# Patient Record
Sex: Female | Born: 1945 | Race: Black or African American | Hispanic: No | Marital: Single | Smoking: Former smoker
Health system: Southern US, Community
[De-identification: ages and names within clinical notes are randomized; demographics above are authoritative.]

## PROBLEM LIST (undated history)

## (undated) DIAGNOSIS — I2510 Atherosclerotic heart disease of native coronary artery without angina pectoris: Secondary | ICD-10-CM

## (undated) DIAGNOSIS — E119 Type 2 diabetes mellitus without complications: Secondary | ICD-10-CM

## (undated) DIAGNOSIS — I10 Essential (primary) hypertension: Secondary | ICD-10-CM

## (undated) DIAGNOSIS — R32 Unspecified urinary incontinence: Secondary | ICD-10-CM

## (undated) DIAGNOSIS — B019 Varicella without complication: Secondary | ICD-10-CM

## (undated) DIAGNOSIS — E785 Hyperlipidemia, unspecified: Secondary | ICD-10-CM

## (undated) HISTORY — DX: Essential (primary) hypertension: I10

## (undated) HISTORY — DX: Type 2 diabetes mellitus without complications: E11.9

## (undated) HISTORY — DX: Hyperlipidemia, unspecified: E78.5

## (undated) HISTORY — PX: ABDOMINAL HYSTERECTOMY: SHX81

## (undated) HISTORY — DX: Varicella without complication: B01.9

## (undated) HISTORY — DX: Unspecified urinary incontinence: R32

## (undated) HISTORY — PX: CORONARY ARTERY BYPASS GRAFT: SHX141

## (undated) HISTORY — PX: EYE SURGERY: SHX253

## (undated) HISTORY — DX: Atherosclerotic heart disease of native coronary artery without angina pectoris: I25.10

---

## 2000-05-21 ENCOUNTER — Inpatient Hospital Stay (HOSPITAL_COMMUNITY): Admission: EM | Admit: 2000-05-21 | Discharge: 2000-05-31 | Payer: Self-pay | Admitting: *Deleted

## 2000-05-21 ENCOUNTER — Encounter: Payer: Self-pay | Admitting: Emergency Medicine

## 2000-05-22 HISTORY — PX: CARDIAC CATHETERIZATION: SHX172

## 2000-05-25 ENCOUNTER — Encounter: Payer: Self-pay | Admitting: Surgery

## 2000-05-27 ENCOUNTER — Encounter: Payer: Self-pay | Admitting: Surgery

## 2001-11-12 ENCOUNTER — Encounter: Payer: Self-pay | Admitting: Cardiology

## 2001-11-12 ENCOUNTER — Ambulatory Visit (HOSPITAL_COMMUNITY): Admission: RE | Admit: 2001-11-12 | Discharge: 2001-11-12 | Payer: Self-pay | Admitting: Cardiology

## 2007-03-10 HISTORY — PX: US ECHOCARDIOGRAPHY: HXRAD669

## 2010-06-02 ENCOUNTER — Other Ambulatory Visit: Payer: Self-pay | Admitting: Cardiology

## 2010-06-02 DIAGNOSIS — I10 Essential (primary) hypertension: Secondary | ICD-10-CM

## 2010-06-05 NOTE — Telephone Encounter (Signed)
escribe request Will put her on recall list for appoint in 2 months per Dr. Patty Sermons

## 2010-08-05 ENCOUNTER — Telehealth: Payer: Self-pay | Admitting: Cardiology

## 2010-08-05 NOTE — Telephone Encounter (Signed)
ATTEMPT TO CONTACT PT VIA PHONE.  RECEIVED RETURNED MAIL.  NEED TO UPDATE ADDRESS.

## 2011-05-14 ENCOUNTER — Encounter: Payer: Self-pay | Admitting: *Deleted

## 2012-10-15 ENCOUNTER — Ambulatory Visit (INDEPENDENT_AMBULATORY_CARE_PROVIDER_SITE_OTHER): Payer: Medicare Other | Admitting: Cardiology

## 2012-10-15 ENCOUNTER — Encounter: Payer: Self-pay | Admitting: Cardiology

## 2012-10-15 VITALS — BP 159/119 | HR 106 | Ht 65.0 in | Wt 233.0 lb

## 2012-10-15 DIAGNOSIS — E1169 Type 2 diabetes mellitus with other specified complication: Secondary | ICD-10-CM | POA: Insufficient documentation

## 2012-10-15 DIAGNOSIS — I119 Hypertensive heart disease without heart failure: Secondary | ICD-10-CM

## 2012-10-15 DIAGNOSIS — I2510 Atherosclerotic heart disease of native coronary artery without angina pectoris: Secondary | ICD-10-CM | POA: Insufficient documentation

## 2012-10-15 DIAGNOSIS — I259 Chronic ischemic heart disease, unspecified: Secondary | ICD-10-CM

## 2012-10-15 DIAGNOSIS — I10 Essential (primary) hypertension: Secondary | ICD-10-CM | POA: Insufficient documentation

## 2012-10-15 DIAGNOSIS — E7800 Pure hypercholesterolemia, unspecified: Secondary | ICD-10-CM

## 2012-10-15 DIAGNOSIS — E785 Hyperlipidemia, unspecified: Secondary | ICD-10-CM | POA: Insufficient documentation

## 2012-10-15 LAB — CBC WITH DIFFERENTIAL/PLATELET
Basophils Relative: 0.4 % (ref 0.0–3.0)
Eosinophils Relative: 1.1 % (ref 0.0–5.0)
HCT: 39.8 % (ref 36.0–46.0)
Hemoglobin: 13.2 g/dL (ref 12.0–15.0)
Lymphs Abs: 2.5 10*3/uL (ref 0.7–4.0)
Monocytes Relative: 10 % (ref 3.0–12.0)
Neutro Abs: 3 10*3/uL (ref 1.4–7.7)
WBC: 6.3 10*3/uL (ref 4.5–10.5)

## 2012-10-15 LAB — HEPATIC FUNCTION PANEL
ALT: 16 U/L (ref 0–35)
Total Bilirubin: 0.6 mg/dL (ref 0.3–1.2)
Total Protein: 7.7 g/dL (ref 6.0–8.3)

## 2012-10-15 LAB — BASIC METABOLIC PANEL
GFR: 83.61 mL/min (ref >60.00)
Potassium: 4.6 mEq/L (ref 3.5–5.1)
Sodium: 139 mEq/L (ref 135–145)

## 2012-10-15 LAB — LIPID PANEL
HDL: 36.7 mg/dL — ABNORMAL LOW (ref >39.00)
Triglycerides: 134 mg/dL (ref 0.0–149.0)
VLDL: 26.8 mg/dL (ref 0.0–40.0)

## 2012-10-15 MED ORDER — METOPROLOL SUCCINATE ER 25 MG PO TB24: 25.0000 mg | ORAL_TABLET | Freq: Every day | ORAL | Status: DC

## 2012-10-15 MED ORDER — HYDROCHLOROTHIAZIDE 25 MG PO TABS: ORAL_TABLET | ORAL | Status: DC

## 2012-10-15 NOTE — Patient Instructions (Addendum)
Will obtain labs today and call you with the results (lp/bmet/hfp)  START TOPROL XL (METOPROLOL) 25 MG DAILY  HCTZ 25 MG 1/2 TABLET DAILY  RX's have been sent to [facility]  TAKE THE ASPIRIN 81 MG DAILY  Your physician recommends that you schedule a follow-up appointment in: 2 WEEKS

## 2012-10-15 NOTE — Assessment & Plan Note (Signed)
Blood pressure is excessive.  Repeat by me is 156/115.  We are going to restart her on Toprol-XL 25 mg daily and hydrochlorothiazide 25 mg one half tablet daily.  She is watching her dietary salt.  Since we last saw her 4 years ago her weight is down 12 pounds.

## 2012-10-15 NOTE — Assessment & Plan Note (Signed)
The patient has not been experiencing any recurrent chest pain or angina pectoris or symptoms of CHF

## 2012-10-15 NOTE — Progress Notes (Signed)
Haley Curtis Date of Birth:  [birth year] [facility] [address] [place], [place]  [address] [phone]         Fax   [phone]  History of Present Illness: This pleasant 67 year old African American woman is seen after a 4 year absence.  She has a history of known coronary artery disease.  She had previous coronary artery bypass graft surgery in 2002.  We last saw her in 2010.  She has had a history of hypertension and hypercholesterolemia.  She was doing well when last saw her.  Since then she has stopped her medication.  Her blood pressure today is high.  She has not been experiencing any recurrent chest pain or angina.  She has had some headaches.  Current Outpatient Prescriptions  Medication Sig Dispense Refill  . aspirin 81 MG tablet Take 81 mg by mouth daily.      . Multiple Vitamin (MULTIVITAMIN) capsule Take 1 capsule by mouth daily.      . hydrochlorothiazide (HYDRODIURIL) 25 MG tablet 1/2 tablet daily  30 tablet  5  . metoprolol succinate (TOPROL XL) 25 MG 24 hr tablet Take 1 tablet (25 mg total) by mouth daily.  30 tablet  5   No current facility-administered medications for this visit.    No Known Allergies  Patient Active Problem List   Diagnosis Date Noted  . Ischemic heart disease 10/15/2012  . Hypercholesterolemia 10/15/2012  . Benign hypertensive heart disease without heart failure 10/15/2012    History  Smoking status  . Former Smoker  . Quit date: 02/04/2000  Smokeless tobacco  . Not on file    History  Alcohol Use No    Family History  Problem Relation Age of Onset  . Hypertension Mother   . Stroke Mother   . Heart attack Father   . Heart attack Brother     Review of Systems: Constitutional: no fever chills diaphoresis or fatigue or change in weight.  Head and neck: no hearing loss, no epistaxis, no photophobia or visual disturbance. Respiratory: No cough, shortness of breath or wheezing. Cardiovascular: No chest  pain peripheral edema, palpitations. Gastrointestinal: No abdominal distention, no abdominal pain, no change in bowel habits hematochezia or melena. Genitourinary: No dysuria, no frequency, no urgency, no nocturia. Musculoskeletal:No arthralgias, no back pain, no gait disturbance or myalgias. Neurological: No dizziness, no headaches, no numbness, no seizures, no syncope, no weakness, no tremors. Hematologic: No lymphadenopathy, no easy bruising. Psychiatric: No confusion, no hallucinations, no sleep disturbance.    Physical Exam: Filed Vitals:   10/15/12 1445  BP: 159/119  Pulse: 106   the general appearance reveals a well-developed well-nourished middle-aged woman in no distress.The head and neck exam reveals pupils equal and reactive.  Extraocular movements are full.  There is no scleral icterus.  The mouth and pharynx are normal.  The neck is supple.  The carotids reveal no bruits.  The jugular venous pressure is normal.  The  thyroid is not enlarged.  There is no lymphadenopathy.  The chest is clear to percussion and auscultation.  There are no rales or rhonchi.  Expansion of the chest is symmetrical.  The precordium is quiet.  The first heart sound is normal.  The second heart sound is physiologically split.  There is no murmur gallop rub or click.  There is no abnormal lift or heave.  The abdomen is soft and nontender.  The bowel sounds are normal.  The liver and spleen are not enlarged.  There are no abdominal masses.  There are no abdominal bruits.  Extremities reveal good pedal pulses.  There is no phlebitis or edema.  There is no cyanosis or clubbing.  Strength is normal and symmetrical in all extremities.  There is no lateralizing weakness.  There are no sensory deficits.  The skin is warm and dry.  There is no rash.  EKG shows normal sinus rhythm and nonspecific T-wave abnormality   Assessment / Plan: Uncontrolled essential hypertension. Stable ischemic heart disease status post  CABG in 2002 History of dyslipidemia  Plan: Restart Toprol and hydrochlorothiazide.  Continue baby aspirin.  Blood work today pending to determine treatment of lipids. Return in 2 weeks for followup office visit to be sure her blood pressure is satisfactory

## 2012-10-15 NOTE — Assessment & Plan Note (Signed)
Previously the patient had been on Crestor.  We will obtain fasting labs and then decide what statin she should be on.

## 2012-10-15 NOTE — Progress Notes (Signed)
Quick Note:  Please report to patient. The recent labs are stable. Continue same medication and careful diet.Add lipitor 20 mg daily ______

## 2012-10-18 ENCOUNTER — Telehealth: Payer: Self-pay | Admitting: *Deleted

## 2012-10-18 DIAGNOSIS — E7800 Pure hypercholesterolemia, unspecified: Secondary | ICD-10-CM

## 2012-10-18 MED ORDER — ATORVASTATIN CALCIUM 20 MG PO TABS: 20.0000 mg | ORAL_TABLET | Freq: Every day | ORAL | Status: DC

## 2012-10-18 NOTE — Telephone Encounter (Signed)
Advised patient of lab results  

## 2012-10-18 NOTE — Telephone Encounter (Signed)
Message copied by Burnell Blanks on Mon Oct 18, 2012  5:46 PM ------      Message from: Cassell Clement      Created: Fri Oct 15, 2012  9:35 PM       Please report to patient.  The recent labs are stable. Continue same medication and careful diet.Add lipitor 20 mg daily ------

## 2012-10-27 ENCOUNTER — Encounter: Payer: Self-pay | Admitting: *Deleted

## 2012-11-01 ENCOUNTER — Ambulatory Visit (INDEPENDENT_AMBULATORY_CARE_PROVIDER_SITE_OTHER): Payer: Medicare Other | Admitting: Cardiology

## 2012-11-01 ENCOUNTER — Encounter: Payer: Self-pay | Admitting: Cardiology

## 2012-11-01 VITALS — BP 160/92 | HR 76 | Ht 65.0 in | Wt 234.0 lb

## 2012-11-01 DIAGNOSIS — I119 Hypertensive heart disease without heart failure: Secondary | ICD-10-CM | POA: Diagnosis not present

## 2012-11-01 DIAGNOSIS — E7800 Pure hypercholesterolemia, unspecified: Secondary | ICD-10-CM | POA: Diagnosis not present

## 2012-11-01 DIAGNOSIS — I259 Chronic ischemic heart disease, unspecified: Secondary | ICD-10-CM

## 2012-11-01 MED ORDER — SIMVASTATIN 40 MG PO TABS: 40.0000 mg | ORAL_TABLET | Freq: Every day | ORAL | Status: DC

## 2012-11-01 NOTE — Assessment & Plan Note (Signed)
Her generic Lipitor is too expensive for her.  We will switch her to simvastatin 40 mg once daily.

## 2012-11-01 NOTE — Patient Instructions (Addendum)
Your physician recommends that you schedule a follow-up appointment in: 3 months with fasting labs (LP/BMET/HFP)   STOP YOUR LIPITOR AND START SIMVASTATIN 40 MG DAILY
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
## 2012-11-01 NOTE — Progress Notes (Signed)
Haley Curtis Date of Birth:  08/21/1945 [facility] [address] [place], [place]  [address] [phone]         Fax   [phone]  History of Present Illness: This pleasant 67 year old [place] American woman is seen for a two-week followup office visit.  We had seen her earlier this month after a 4 year absence.  She has a history of known coronary artery disease.  She had previous coronary artery bypass graft surgery in 2002.  We last saw her in 2010.  She has had a history of hypertension and hypercholesterolemia.  We saw her 2 weeks ago her blood pressure was poorly controlled and her lipids were poorly controlled with LDL of 183.  We prescribed generic Lipitor for her but it was very expensive, costing $130 for 30 day prescription.  She will not have insurance for drug coverage until January. Current Outpatient Prescriptions  Medication Sig Dispense Refill  . aspirin 81 MG tablet Take 81 mg by mouth daily.      . hydrochlorothiazide (HYDRODIURIL) 25 MG tablet 1/2 tablet daily  30 tablet  5  . metoprolol succinate (TOPROL XL) 25 MG 24 hr tablet Take 1 tablet (25 mg total) by mouth daily.  30 tablet  5  . Multiple Vitamin (MULTIVITAMIN) capsule Take 1 capsule by mouth daily.      . simvastatin (ZOCOR) 40 MG tablet Take 1 tablet (40 mg total) by mouth at bedtime.  90 tablet  3   No current facility-administered medications for this visit.    No Known Allergies  Patient Active Problem List   Diagnosis Date Noted  . Ischemic heart disease 10/15/2012  . Hypercholesterolemia 10/15/2012  . Benign hypertensive heart disease without heart failure 10/15/2012    History  Smoking status  . Former Smoker  . Quit date: 02/04/2000  Smokeless tobacco  . Not on file    History  Alcohol Use No    Family History  Problem Relation Age of Onset  . Hypertension Mother   . Stroke Mother   . Heart attack Father   . Heart attack Brother     Review of  Systems: Constitutional: no fever chills diaphoresis or fatigue or change in weight.  Head and neck: no hearing loss, no epistaxis, no photophobia or visual disturbance. Respiratory: No cough, shortness of breath or wheezing. Cardiovascular: No chest pain peripheral edema, palpitations. Gastrointestinal: No abdominal distention, no abdominal pain, no change in bowel habits hematochezia or melena. Genitourinary: No dysuria, no frequency, no urgency, no nocturia. Musculoskeletal:No arthralgias, no back pain, no gait disturbance or myalgias. Neurological: No dizziness, no headaches, no numbness, no seizures, no syncope, no weakness, no tremors. Hematologic: No lymphadenopathy, no easy bruising. Psychiatric: No confusion, no hallucinations, no sleep disturbance.    Physical Exam: Filed Vitals:   11/01/12 0850  BP: 160/92  Pulse: 76   the general appearance reveals a well-developed well-nourished middle-aged woman in no distress.The head and neck exam reveals pupils equal and reactive.  Extraocular movements are full.  There is no scleral icterus.  The mouth and pharynx are normal.  The neck is supple.  The carotids reveal no bruits.  The jugular venous pressure is normal.  The  thyroid is not enlarged.  There is no lymphadenopathy.  The chest is clear to percussion and auscultation.  There are no rales or rhonchi.  Expansion of the chest is symmetrical.  The precordium is quiet.  The first heart sound is normal.  The second heart sound is physiologically split.  There is no murmur gallop rub or click.  There is no abnormal lift or heave.  The abdomen is soft and nontender.  The bowel sounds are normal.  The liver and spleen are not enlarged.  There are no abdominal masses.  There are no abdominal bruits.  Extremities reveal good pedal pulses.  There is no phlebitis or edema.  There is no cyanosis or clubbing.  Strength is normal and symmetrical in all extremities.  There is no lateralizing weakness.   There are no sensory deficits.  The skin is warm and dry.  There is no rash.     Assessment / Plan: Essential hypertension, improving Stable ischemic heart disease status post CABG in 2002.  No chest pain History of dyslipidemia with recent LDL 183 off medication.  Plan: Continue current dose of metoprolol and HCTZ.  Continue baby aspirin.  Switch to simvastatin 40 mg daily for lipid control.  Strict diet and weight loss.  Recheck in 3 months for followup office visit lipid panel hepatic function panel and basal metabolic panel

## 2012-11-01 NOTE — Assessment & Plan Note (Signed)
The patient has not been experiencing any chest pain or angina. 

## 2012-11-08 ENCOUNTER — Other Ambulatory Visit: Payer: Self-pay | Admitting: *Deleted

## 2012-11-08 DIAGNOSIS — I119 Hypertensive heart disease without heart failure: Secondary | ICD-10-CM

## 2012-11-08 DIAGNOSIS — I259 Chronic ischemic heart disease, unspecified: Secondary | ICD-10-CM

## 2012-11-08 DIAGNOSIS — E7800 Pure hypercholesterolemia, unspecified: Secondary | ICD-10-CM

## 2012-11-08 MED ORDER — HYDROCHLOROTHIAZIDE 25 MG PO TABS: ORAL_TABLET | ORAL | Status: DC

## 2012-11-08 MED ORDER — METOPROLOL SUCCINATE ER 25 MG PO TB24: 25.0000 mg | ORAL_TABLET | Freq: Every day | ORAL | Status: DC

## 2012-11-08 NOTE — Telephone Encounter (Signed)
Change pharmacy from [facility] to [facility]

## 2013-01-28 DIAGNOSIS — H52229 Regular astigmatism, unspecified eye: Secondary | ICD-10-CM | POA: Diagnosis not present

## 2013-01-28 DIAGNOSIS — H35379 Puckering of macula, unspecified eye: Secondary | ICD-10-CM | POA: Diagnosis not present

## 2013-01-28 DIAGNOSIS — H524 Presbyopia: Secondary | ICD-10-CM | POA: Diagnosis not present

## 2013-01-28 DIAGNOSIS — H5200 Hypermetropia, unspecified eye: Secondary | ICD-10-CM | POA: Diagnosis not present

## 2013-01-31 ENCOUNTER — Other Ambulatory Visit: Payer: Self-pay

## 2013-01-31 ENCOUNTER — Other Ambulatory Visit (INDEPENDENT_AMBULATORY_CARE_PROVIDER_SITE_OTHER): Payer: Medicare Other

## 2013-01-31 DIAGNOSIS — E7800 Pure hypercholesterolemia, unspecified: Secondary | ICD-10-CM

## 2013-01-31 DIAGNOSIS — I119 Hypertensive heart disease without heart failure: Secondary | ICD-10-CM

## 2013-01-31 DIAGNOSIS — I259 Chronic ischemic heart disease, unspecified: Secondary | ICD-10-CM

## 2013-01-31 LAB — HEPATIC FUNCTION PANEL
Albumin: 3.9 g/dL (ref 3.5–5.2)
Alkaline Phosphatase: 67 U/L (ref 39–117)
Total Bilirubin: 0.8 mg/dL (ref 0.3–1.2)
Total Protein: 6.9 g/dL (ref 6.0–8.3)

## 2013-01-31 LAB — LIPID PANEL
Cholesterol: 139 mg/dL (ref 0–200)
HDL: 35.7 mg/dL — ABNORMAL LOW (ref >39.00)
Triglycerides: 102 mg/dL (ref 0.0–149.0)

## 2013-01-31 LAB — BASIC METABOLIC PANEL
BUN: 18 mg/dL (ref 6–23)
CO2: 27 mEq/L (ref 19–32)
Calcium: 9.6 mg/dL (ref 8.4–10.5)
Creatinine, Ser: 1 mg/dL (ref 0.4–1.2)
Glucose, Bld: 308 mg/dL — ABNORMAL HIGH (ref 70–99)
Sodium: 138 mEq/L (ref 135–145)

## 2013-01-31 MED ORDER — HYDROCHLOROTHIAZIDE 25 MG PO TABS: ORAL_TABLET | ORAL | Status: DC

## 2013-01-31 MED ORDER — SIMVASTATIN 40 MG PO TABS: 40.0000 mg | ORAL_TABLET | Freq: Every day | ORAL | Status: DC

## 2013-01-31 MED ORDER — METOPROLOL SUCCINATE ER 25 MG PO TB24: 25.0000 mg | ORAL_TABLET | Freq: Every day | ORAL | Status: DC

## 2013-02-01 ENCOUNTER — Telehealth: Payer: Self-pay | Admitting: *Deleted

## 2013-02-01 ENCOUNTER — Encounter: Payer: Self-pay | Admitting: *Deleted

## 2013-02-01 DIAGNOSIS — E113419 Type 2 diabetes mellitus with severe nonproliferative diabetic retinopathy with macular edema, unspecified eye: Secondary | ICD-10-CM | POA: Insufficient documentation

## 2013-02-01 DIAGNOSIS — E118 Type 2 diabetes mellitus with unspecified complications: Secondary | ICD-10-CM | POA: Insufficient documentation

## 2013-02-01 MED ORDER — METFORMIN HCL 500 MG PO TABS: 500.0000 mg | ORAL_TABLET | Freq: Every day | ORAL | Status: DC

## 2013-02-01 NOTE — Telephone Encounter (Signed)
Advised patient of results and new Rx, scheduled follow up labs and ov

## 2013-02-01 NOTE — Telephone Encounter (Signed)
Message copied by Burnell Blanks on Tue Feb 01, 2013  2:50 PM ------      Message from: Cassell Clement      Created: Tue Feb 01, 2013  9:28 AM       Please report.  Cholesterol good but BS is very high. Add 250.02 to problem list. Needs to follow a diabetic diet. Add metformin 500 mg daily. See in about a month and get BMET and A1C and an OV.  Lose weight. ------

## 2013-02-09 DIAGNOSIS — H35379 Puckering of macula, unspecified eye: Secondary | ICD-10-CM | POA: Diagnosis not present

## 2013-02-09 DIAGNOSIS — H3340 Traction detachment of retina, unspecified eye: Secondary | ICD-10-CM | POA: Diagnosis not present

## 2013-02-09 DIAGNOSIS — S0510XA Contusion of eyeball and orbital tissues, unspecified eye, initial encounter: Secondary | ICD-10-CM | POA: Diagnosis not present

## 2013-02-09 DIAGNOSIS — H4310 Vitreous hemorrhage, unspecified eye: Secondary | ICD-10-CM | POA: Diagnosis not present

## 2013-02-17 DIAGNOSIS — H35059 Retinal neovascularization, unspecified, unspecified eye: Secondary | ICD-10-CM | POA: Diagnosis not present

## 2013-02-17 DIAGNOSIS — H35379 Puckering of macula, unspecified eye: Secondary | ICD-10-CM | POA: Diagnosis not present

## 2013-02-17 DIAGNOSIS — H35349 Macular cyst, hole, or pseudohole, unspecified eye: Secondary | ICD-10-CM | POA: Diagnosis not present

## 2013-02-25 DIAGNOSIS — H35379 Puckering of macula, unspecified eye: Secondary | ICD-10-CM | POA: Diagnosis not present

## 2013-03-02 ENCOUNTER — Encounter: Payer: Self-pay | Admitting: Cardiology

## 2013-03-02 ENCOUNTER — Ambulatory Visit (INDEPENDENT_AMBULATORY_CARE_PROVIDER_SITE_OTHER): Payer: Medicare Other | Admitting: Cardiology

## 2013-03-02 VITALS — BP 152/97 | HR 95 | Ht 65.0 in | Wt 216.0 lb

## 2013-03-02 DIAGNOSIS — E1165 Type 2 diabetes mellitus with hyperglycemia: Principal | ICD-10-CM

## 2013-03-02 DIAGNOSIS — I259 Chronic ischemic heart disease, unspecified: Secondary | ICD-10-CM

## 2013-03-02 DIAGNOSIS — IMO0001 Reserved for inherently not codable concepts without codable children: Secondary | ICD-10-CM

## 2013-03-02 DIAGNOSIS — E7800 Pure hypercholesterolemia, unspecified: Secondary | ICD-10-CM

## 2013-03-02 DIAGNOSIS — I119 Hypertensive heart disease without heart failure: Secondary | ICD-10-CM

## 2013-03-02 LAB — BASIC METABOLIC PANEL
BUN: 20 mg/dL (ref 6–23)
CHLORIDE: 99 meq/L (ref 96–112)
CO2: 30 mEq/L (ref 19–32)
Calcium: 9.8 mg/dL (ref 8.4–10.5)
Creatinine, Ser: 0.9 mg/dL (ref 0.4–1.2)
GFR: 84.63 mL/min (ref >60.00)
Glucose, Bld: 248 mg/dL — ABNORMAL HIGH (ref 70–99)
POTASSIUM: 4.1 meq/L (ref 3.5–5.1)
SODIUM: 136 meq/L (ref 135–145)

## 2013-03-02 LAB — HEMOGLOBIN A1C: HEMOGLOBIN A1C: 12.5 % — AB (ref 4.6–6.5)

## 2013-03-02 NOTE — Patient Instructions (Signed)
Will obtain labs today and call you with the results (bmet/a1)  Your physician recommends that you continue on your current medications as directed. Please refer to the Current Medication list given to you today.  Your physician wants you to follow-up in: 4 months with fasting labs (lp/bmet/hfp/a1c)  You will receive a reminder letter in the mail two months in advance. If you don't receive a letter, please call our office to schedule the follow-up appointment.

## 2013-03-02 NOTE — Assessment & Plan Note (Signed)
Her dyspnea has improved since last visit.  Her 18 pound weight loss has helped this.  She is not having any symptoms of CHF

## 2013-03-02 NOTE — Assessment & Plan Note (Signed)
The patient has not had any recurrent chest pain or angina. 

## 2013-03-02 NOTE — Assessment & Plan Note (Signed)
The patient has been on a careful diet and has lost 18 pounds since last visit.

## 2013-03-02 NOTE — Progress Notes (Signed)
Haley SporeLena Surber Date of Birth:  [birth year] [address] [place], [place]  [id number] [phone]         Fax   [phone]  History of Present Illness: This pleasant 68 year old African American woman is seen for a four-month followup office visit.  We had seen her in September 2014 after a 4 year absence.  She has a history of known coronary artery disease.  She had previous coronary artery bypass graft surgery in 2002.  We last saw her in 2010.  She has had a history of hypertension and hypercholesterolemia.  Since last visit she has been doing better in terms of her medical issues.  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 Stephannie LiJason Sanders on 02/17/2013.   Current Outpatient Prescriptions  Medication Sig Dispense Refill  . aspirin 81 MG tablet Take 81 mg by mouth daily.      . hydrochlorothiazide (HYDRODIURIL) 25 MG tablet 1/2 tablet daily  90 tablet  3  . metFORMIN (GLUCOPHAGE) 500 MG tablet Take 1 tablet (500 mg total) by mouth daily.  90 tablet  1  . metoprolol succinate (TOPROL XL) 25 MG 24 hr tablet Take 1 tablet (25 mg total) by mouth daily.  90 tablet  3  . Multiple Vitamin (MULTIVITAMIN) capsule Take 1 capsule by mouth daily.      . simvastatin (ZOCOR) 40 MG tablet Take 1 tablet (40 mg total) by mouth at bedtime.  90 tablet  3   No current facility-administered medications for this visit.    No Known Allergies  Patient Active Problem List   Diagnosis Date Noted  . Type II or unspecified type diabetes mellitus without mention of complication, uncontrolled 02/01/2013  . Ischemic heart disease 10/15/2012  . Hypercholesterolemia 10/15/2012  . Benign hypertensive heart disease without heart failure 10/15/2012    History  Smoking status  . Former Smoker  . Quit date: 02/04/2000  Smokeless tobacco  . Not on file    History  Alcohol Use No    Family History  Problem Relation Age of Onset  . Hypertension Mother    . Stroke Mother   . Heart attack Father   . Heart attack Brother     Review of Systems: Constitutional: no fever chills diaphoresis or fatigue or change in weight.  Head and neck: no hearing loss, no epistaxis, no photophobia or visual disturbance. Respiratory: No cough, shortness of breath or wheezing. Cardiovascular: No chest pain peripheral edema, palpitations. Gastrointestinal: No abdominal distention, no abdominal pain, no change in bowel habits hematochezia or melena. Genitourinary: No dysuria, no frequency, no urgency, no nocturia. Musculoskeletal:No arthralgias, no back pain, no gait disturbance or myalgias. Neurological: No dizziness, no headaches, no numbness, no seizures, no syncope, no weakness, no tremors. Hematologic: No lymphadenopathy, no easy bruising. Psychiatric: No confusion, no hallucinations, no sleep disturbance.    Physical Exam: Filed Vitals:   03/02/13 1011  BP: 152/97  Pulse: 95   the general appearance reveals a well-developed well-nourished middle-aged woman in no distress.The head and neck exam reveals pupils equal and reactive.  Extraocular movements are full.  There is no scleral icterus.  The mouth and pharynx are normal.  The neck is supple.  The carotids reveal no bruits.  The jugular venous pressure is normal.  The  thyroid is not enlarged.  There is no lymphadenopathy.  The chest is clear to percussion and auscultation.  There are no rales or  rhonchi.  Expansion of the chest is symmetrical.  The precordium is quiet.  The first heart sound is normal.  The second heart sound is physiologically split.  There is no murmur gallop rub or click.  There is no abnormal lift or heave.  The abdomen is soft and nontender.  The bowel sounds are normal.  The liver and spleen are not enlarged.  There are no abdominal masses.  There are no abdominal bruits.  Extremities reveal good pedal pulses.  There is no phlebitis or edema.  There is no cyanosis or clubbing.   Strength is normal and symmetrical in all extremities.  There is no lateralizing weakness.  There are no sensory deficits.  The skin is warm and dry.  There is no rash.     Assessment / Plan: Essential hypertension, improving Stable ischemic heart disease status post CABG in 2002.  No chest pain History of dyslipidemia with recent LDL 183 off medication.  Repeat labs are improved. She will continue same medication.  She is now on metformin for her diabetes and we are checking an A1c and a basal metabolic panel today. Recheck in 4 months for office visit A1c lipid panel hepatic function panel and basal metabolic panel.  Continue weight loss and careful diet

## 2013-03-03 ENCOUNTER — Telehealth: Payer: Self-pay | Admitting: *Deleted

## 2013-03-03 DIAGNOSIS — E1165 Type 2 diabetes mellitus with hyperglycemia: Principal | ICD-10-CM

## 2013-03-03 DIAGNOSIS — IMO0001 Reserved for inherently not codable concepts without codable children: Secondary | ICD-10-CM

## 2013-03-03 MED ORDER — METFORMIN HCL 500 MG PO TABS: 500.0000 mg | ORAL_TABLET | Freq: Two times a day (BID) | ORAL | Status: DC

## 2013-03-03 NOTE — Telephone Encounter (Signed)
Advised patient of lab results  

## 2013-03-03 NOTE — Telephone Encounter (Signed)
Message copied by Burnell BlanksPRATT, Jeric Slagel B on Thu Mar 03, 2013  9:04 AM ------      Message from: Cassell ClementBRACKBILL, THOMAS      Created: Wed Mar 02, 2013  9:13 PM       A1C very high.  BS still too high since starting metformin.  Increase metformin to 500 mg BID. ------

## 2013-03-23 DIAGNOSIS — H35379 Puckering of macula, unspecified eye: Secondary | ICD-10-CM | POA: Diagnosis not present

## 2013-03-29 ENCOUNTER — Encounter: Payer: Self-pay | Admitting: Cardiology

## 2013-07-05 ENCOUNTER — Ambulatory Visit (INDEPENDENT_AMBULATORY_CARE_PROVIDER_SITE_OTHER): Payer: Medicare Other | Admitting: Cardiology

## 2013-07-05 ENCOUNTER — Other Ambulatory Visit (INDEPENDENT_AMBULATORY_CARE_PROVIDER_SITE_OTHER): Payer: Medicare Other

## 2013-07-05 ENCOUNTER — Encounter: Payer: Self-pay | Admitting: Cardiology

## 2013-07-05 ENCOUNTER — Encounter (INDEPENDENT_AMBULATORY_CARE_PROVIDER_SITE_OTHER): Payer: Self-pay

## 2013-07-05 VITALS — BP 153/87 | HR 79 | Ht 65.0 in | Wt 226.0 lb

## 2013-07-05 DIAGNOSIS — IMO0001 Reserved for inherently not codable concepts without codable children: Secondary | ICD-10-CM

## 2013-07-05 DIAGNOSIS — I119 Hypertensive heart disease without heart failure: Secondary | ICD-10-CM

## 2013-07-05 DIAGNOSIS — E1165 Type 2 diabetes mellitus with hyperglycemia: Principal | ICD-10-CM

## 2013-07-05 DIAGNOSIS — E7800 Pure hypercholesterolemia, unspecified: Secondary | ICD-10-CM

## 2013-07-05 DIAGNOSIS — I259 Chronic ischemic heart disease, unspecified: Secondary | ICD-10-CM

## 2013-07-05 LAB — BASIC METABOLIC PANEL
BUN: 18 mg/dL (ref 6–23)
CHLORIDE: 104 meq/L (ref 96–112)
CO2: 27 mEq/L (ref 19–32)
Calcium: 9.3 mg/dL (ref 8.4–10.5)
Creatinine, Ser: 0.9 mg/dL (ref 0.4–1.2)
GFR: 81.27 mL/min (ref >60.00)
Glucose, Bld: 127 mg/dL — ABNORMAL HIGH (ref 70–99)
POTASSIUM: 3.8 meq/L (ref 3.5–5.1)
Sodium: 140 mEq/L (ref 135–145)

## 2013-07-05 LAB — HEPATIC FUNCTION PANEL
ALBUMIN: 3.7 g/dL (ref 3.5–5.2)
ALT: 21 U/L (ref 0–35)
AST: 15 U/L (ref 0–37)
Alkaline Phosphatase: 86 U/L (ref 39–117)
Bilirubin, Direct: 0.1 mg/dL (ref 0.0–0.3)
Total Bilirubin: 0.6 mg/dL (ref 0.2–1.2)
Total Protein: 7.3 g/dL (ref 6.0–8.3)

## 2013-07-05 LAB — LIPID PANEL
Cholesterol: 153 mg/dL (ref 0–200)
HDL: 38.5 mg/dL — AB (ref >39.00)
LDL CALC: 86 mg/dL (ref 0–99)
TRIGLYCERIDES: 142 mg/dL (ref 0.0–149.0)
Total CHOL/HDL Ratio: 4
VLDL: 28.4 mg/dL (ref 0.0–40.0)

## 2013-07-05 LAB — HEMOGLOBIN A1C: Hgb A1c MFr Bld: 7.3 % — ABNORMAL HIGH (ref 4.6–6.5)

## 2013-07-05 NOTE — Assessment & Plan Note (Signed)
No episodes of hypoglycemia.

## 2013-07-05 NOTE — Assessment & Plan Note (Signed)
Blood pressure is higher today because of a 10 pound weight gain since last visit.  She is trying to exercise.  She uses an exercise bicycle for about one hour at least 4 days a week. She has not been having any headaches or dizzy spells

## 2013-07-05 NOTE — Patient Instructions (Signed)
Will obtain labs today and call you with the results (lp.bmet/hfp/a1c)  Your physician recommends that you continue on your current medications as directed. Please refer to the Current Medication list given to you today.  Your physician wants you to follow-up in: 6 months with fasting labs (lp/bmet/hfp/a1c)  You will receive a reminder letter in the mail two months in advance. If you don't receive a letter, please call our office to schedule the follow-up appointment.  

## 2013-07-05 NOTE — Assessment & Plan Note (Signed)
No chest pain or angina.  

## 2013-07-05 NOTE — Progress Notes (Signed)
Haley SporeLena Curtis Date of Birth:  1945/12/14 [facility] [address] [place], [place]  [id number] [phone]        Fax   [phone]   History of Present Illness: This pleasant 68 year old [place] woman is seen for a six-month followup office visit. We had seen her in September 2014 after a 4 year absence. She has a history of known coronary artery disease. She had previous coronary artery bypass graft surgery in 2002 . We last saw her in 2010. She has had a history of hypertension and hypercholesterolemia. Since last visit she has been doing better in terms of her medical issues.  She has been trying to lose weight.  However her weight is up 10 pounds since last visit.  Current Outpatient Prescriptions  Medication Sig Dispense Refill  . aspirin 81 MG tablet Take 81 mg by mouth daily.      . hydrochlorothiazide (HYDRODIURIL) 25 MG tablet 1/2 tablet daily  90 tablet  3  . metFORMIN (GLUCOPHAGE) 500 MG tablet Take 1 tablet (500 mg total) by mouth 2 (two) times daily with a meal.  180 tablet  3  . metoprolol succinate (TOPROL XL) 25 MG 24 hr tablet Take 1 tablet (25 mg total) by mouth daily.  90 tablet  3  . Multiple Vitamin (MULTIVITAMIN) capsule Take 1 capsule by mouth daily.      . simvastatin (ZOCOR) 40 MG tablet Take 1 tablet (40 mg total) by mouth at bedtime.  90 tablet  3   No current facility-administered medications for this visit.    No Known Allergies  Patient Active Problem List   Diagnosis Date Noted  . Type II or unspecified type diabetes mellitus without mention of complication, uncontrolled 02/01/2013  . Ischemic heart disease 10/15/2012  . Hypercholesterolemia 10/15/2012  . Benign hypertensive heart disease without heart failure 10/15/2012    History  Smoking status  . Former Smoker  . Quit date: 02/04/2000  Smokeless tobacco  . Not on file    History  Alcohol Use No    Family History  Problem Relation Age of Onset  .  Hypertension Mother   . Stroke Mother   . Heart attack Father   . Heart attack Brother     Review of Systems: Constitutional: no fever chills diaphoresis or fatigue or change in weight.  Head and neck: no hearing loss, no epistaxis, no photophobia or visual disturbance. Respiratory: No cough, shortness of breath or wheezing. Cardiovascular: No chest pain peripheral edema, palpitations. Gastrointestinal: No abdominal distention, no abdominal pain, no change in bowel habits hematochezia or melena. Genitourinary: No dysuria, no frequency, no urgency, no nocturia. Musculoskeletal:No arthralgias, no back pain, no gait disturbance or myalgias. Neurological: No dizziness, no headaches, no numbness, no seizures, no syncope, no weakness, no tremors. Hematologic: No lymphadenopathy, no easy bruising. Psychiatric: No confusion, no hallucinations, no sleep disturbance.    Physical Exam: Filed Vitals:   07/05/13 0802  BP: 153/87  Pulse: 79   the general appearance reveals a well-developed well-nourished woman in no distress.The head and neck exam reveals pupils equal and reactive.  Extraocular movements are full.  There is no scleral icterus.  The mouth and pharynx are normal.  The neck is supple.  The carotids reveal no bruits.  The jugular venous pressure is normal.  The  thyroid is not enlarged.  There is no lymphadenopathy.  The chest is clear to percussion and auscultation.  There are no rales  or rhonchi.  Expansion of the chest is symmetrical.  The precordium is quiet.  The first heart sound is normal.  The second heart sound is physiologically split.  There is no murmur gallop rub or click.  There is no abnormal lift or heave.  The abdomen is soft and nontender.  The bowel sounds are normal.  The liver and spleen are not enlarged.  There are no abdominal masses.  There are no abdominal bruits.  Extremities reveal good pedal pulses.  There is no phlebitis or edema.  There is no cyanosis or  clubbing.  Strength is normal and symmetrical in all extremities.  There is no lateralizing weakness.  There are no sensory deficits.  The skin is warm and dry.  There is no rash.  She does have a dark nevus on her right cheek and also 1 alongside the left side of her nose.  She states that both of these have been present a long time and have been unchanged in size.     Assessment / Plan: 1. essential hypertension 2. ischemic heart disease status post CABG 2002 3. diabetes mellitus type 2 4. Hypercholesterolemia  Plan: Continue same medication.  Check lab work today.  We filled all of her medicines for 90 days.  Recheck in 6 months for office visit EKG lipid panel hepatic function panel basal metabolic panel and A1c We talked about the importance of weight loss and its beneficial effect on her other medical problems.

## 2013-07-06 NOTE — Progress Notes (Signed)
Quick Note:  Please report to patient. The recent labs are stable. Continue same medication and careful diet. The blood sugar is much better than previously. The A1c is also much better. Continue good program of diet and exercise ______

## 2013-11-09 ENCOUNTER — Other Ambulatory Visit: Payer: Self-pay | Admitting: Cardiology

## 2014-01-10 ENCOUNTER — Encounter: Payer: Self-pay | Admitting: Cardiology

## 2014-01-10 ENCOUNTER — Other Ambulatory Visit (INDEPENDENT_AMBULATORY_CARE_PROVIDER_SITE_OTHER): Payer: Medicare Other | Admitting: *Deleted

## 2014-01-10 ENCOUNTER — Ambulatory Visit (INDEPENDENT_AMBULATORY_CARE_PROVIDER_SITE_OTHER): Payer: Medicare Other | Admitting: Cardiology

## 2014-01-10 VITALS — BP 130/90 | HR 74 | Ht 65.0 in | Wt 230.0 lb

## 2014-01-10 DIAGNOSIS — E1165 Type 2 diabetes mellitus with hyperglycemia: Secondary | ICD-10-CM

## 2014-01-10 DIAGNOSIS — I259 Chronic ischemic heart disease, unspecified: Secondary | ICD-10-CM

## 2014-01-10 DIAGNOSIS — IMO0002 Reserved for concepts with insufficient information to code with codable children: Secondary | ICD-10-CM

## 2014-01-10 DIAGNOSIS — E7800 Pure hypercholesterolemia, unspecified: Secondary | ICD-10-CM

## 2014-01-10 DIAGNOSIS — I119 Hypertensive heart disease without heart failure: Secondary | ICD-10-CM

## 2014-01-10 DIAGNOSIS — E118 Type 2 diabetes mellitus with unspecified complications: Secondary | ICD-10-CM | POA: Insufficient documentation

## 2014-01-10 DIAGNOSIS — E113419 Type 2 diabetes mellitus with severe nonproliferative diabetic retinopathy with macular edema, unspecified eye: Secondary | ICD-10-CM

## 2014-01-10 DIAGNOSIS — E11341 Type 2 diabetes mellitus with severe nonproliferative diabetic retinopathy with macular edema: Secondary | ICD-10-CM | POA: Diagnosis not present

## 2014-01-10 DIAGNOSIS — E119 Type 2 diabetes mellitus without complications: Secondary | ICD-10-CM | POA: Insufficient documentation

## 2014-01-10 LAB — HEPATIC FUNCTION PANEL
ALBUMIN: 3.7 g/dL (ref 3.5–5.2)
ALT: 18 U/L (ref 0–35)
AST: 22 U/L (ref 0–37)
Alkaline Phosphatase: 79 U/L (ref 39–117)
Bilirubin, Direct: 0 mg/dL (ref 0.0–0.3)
Total Bilirubin: 0.4 mg/dL (ref 0.2–1.2)
Total Protein: 7 g/dL (ref 6.0–8.3)

## 2014-01-10 LAB — BASIC METABOLIC PANEL
BUN: 17 mg/dL (ref 6–23)
CALCIUM: 9.2 mg/dL (ref 8.4–10.5)
CO2: 30 mEq/L (ref 19–32)
Chloride: 102 mEq/L (ref 96–112)
Creatinine, Ser: 0.9 mg/dL (ref 0.4–1.2)
GFR: 79.09 mL/min (ref >60.00)
GLUCOSE: 136 mg/dL — AB (ref 70–99)
Potassium: 3.5 mEq/L (ref 3.5–5.1)
SODIUM: 139 meq/L (ref 135–145)

## 2014-01-10 LAB — LIPID PANEL
CHOLESTEROL: 146 mg/dL (ref 0–200)
HDL: 31.4 mg/dL — AB (ref >39.00)
LDL Cholesterol: 89 mg/dL (ref 0–99)
NonHDL: 114.6
Total CHOL/HDL Ratio: 5
Triglycerides: 129 mg/dL (ref 0.0–149.0)
VLDL: 25.8 mg/dL (ref 0.0–40.0)

## 2014-01-10 LAB — HEMOGLOBIN A1C: Hgb A1c MFr Bld: 7.9 % — ABNORMAL HIGH (ref 4.6–6.5)

## 2014-01-10 NOTE — Progress Notes (Signed)
Haley SporeLena Femia Date of Birth:  27-Feb-1945 [facility] [address] [place], [place]  [address] [phone]        Fax   [phone]   History of Present Illness: This pleasant 68 year old [place] woman is seen for a six-month followup office visit. We had seen her in September 2014 after a 4 year absence. She has a history of known coronary artery disease. She had previous coronary artery bypass graft surgery in 2002 .  She has had a history of hypertension and hypercholesterolemia. Since last visit she has been doing better in terms of her medical issues.  She has been trying to lose weight.  However her weight is up 4 pounds since last visit.  Current Outpatient Prescriptions  Medication Sig Dispense Refill  . aspirin 81 MG tablet Take 81 mg by mouth daily.    . hydrochlorothiazide (HYDRODIURIL) 25 MG tablet 1/2 tablet daily 90 tablet 3  . metFORMIN (GLUCOPHAGE) 500 MG tablet Take 1 tablet (500 mg total) by mouth 2 (two) times daily with a meal. 180 tablet 3  . metoprolol succinate (TOPROL-XL) 25 MG 24 hr tablet TAKE 1 TABLET (25 MG TOTAL) BY MOUTH DAILY. 30 tablet 11  . Multiple Vitamin (MULTIVITAMIN) capsule Take 1 capsule by mouth daily.    . simvastatin (ZOCOR) 40 MG tablet TAKE 1 TABLET (40 MG TOTAL) BY MOUTH AT BEDTIME. 90 tablet 3   No current facility-administered medications for this visit.    No Known Allergies  Patient Active Problem List   Diagnosis Date Noted  . Type 2 diabetes mellitus 01/10/2014  . Type II or unspecified type diabetes mellitus without mention of complication, uncontrolled 02/01/2013  . Ischemic heart disease 10/15/2012  . Hypercholesterolemia 10/15/2012  . Benign hypertensive heart disease without heart failure 10/15/2012    History  Smoking status  . Former Smoker  . Quit date: 02/04/2000  Smokeless tobacco  . Not on file    History  Alcohol Use No    Family History  Problem Relation Age of Onset  .  Hypertension Mother   . Stroke Mother   . Heart attack Father   . Heart attack Brother     Review of Systems: Constitutional: no fever chills diaphoresis or fatigue or change in weight.  Head and neck: no hearing loss, no epistaxis, no photophobia or visual disturbance. Respiratory: No cough, shortness of breath or wheezing. Cardiovascular: No chest pain peripheral edema, palpitations. Gastrointestinal: No abdominal distention, no abdominal pain, no change in bowel habits hematochezia or melena. Genitourinary: No dysuria, no frequency, no urgency, no nocturia. Musculoskeletal:No arthralgias, no back pain, no gait disturbance or myalgias. Neurological: No dizziness, no headaches, no numbness, no seizures, no syncope, no weakness, no tremors. Hematologic: No lymphadenopathy, no easy bruising. Psychiatric: No confusion, no hallucinations, no sleep disturbance.    Physical Exam: Filed Vitals:   01/10/14 0927  BP: 130/90  Pulse: 74   the general appearance reveals a well-developed well-nourished woman in no distress.The head and neck exam reveals pupils equal and reactive.  Extraocular movements are full.  There is no scleral icterus.  The mouth and pharynx are normal.  The neck is supple.  The carotids reveal no bruits.  The jugular venous pressure is normal.  The  thyroid is not enlarged.  There is no lymphadenopathy.  The chest is clear to percussion and auscultation.  There are no rales or rhonchi.  Expansion of the chest is symmetrical.  The  precordium is quiet.  The first heart sound is normal.  The second heart sound is physiologically split.  There is no murmur gallop rub or click.  There is no abnormal lift or heave.  The abdomen is soft and nontender.  The bowel sounds are normal.  The liver and spleen are not enlarged.  There are no abdominal masses.  There are no abdominal bruits.  Extremities reveal good pedal pulses.  There is no phlebitis or edema.  There is no cyanosis or  clubbing.  Strength is normal and symmetrical in all extremities.  There is no lateralizing weakness.  There are no sensory deficits.  The skin is warm and dry.  There is no rash.  She does have a dark nevus on her right cheek and also 1 alongside the left side of her nose.  She states that both of these have been present a long time and have been unchanged in size.  EKG today shows normal sinus rhythm and is unchanged from 10/15/12.  There are nonspecific ST-T wave changes which are stable   Assessment / Plan: 1. essential hypertension 2. ischemic heart disease status post CABG 2002 3. diabetes mellitus type 2 4. Hypercholesterolemia  Plan: Continue same medication.  Check lab work today.  .  Recheck in 6 months for office visit  lipid panel hepatic function panel basal metabolic panel and A1c We talked about the importance of weight loss and its beneficial effect on her other medical problems.

## 2014-01-10 NOTE — Assessment & Plan Note (Signed)
Her blood pressure has been higher.  She attributes that to the stress of 2 recent funerals.  One was her mother-in-law died at age 67.  Another was a high school close friend.

## 2014-01-10 NOTE — Patient Instructions (Signed)
Will obtain labs today and call you with the results (lp/bmet/hfpa1c)  Your physician recommends that you continue on your current medications as directed. Please refer to the Current Medication list given to you today.  Your physician wants you to follow-up in: 6 months with fasting labs (lp/bmet/hfp)  You will receive a reminder letter in the mail two months in advance. If you don't receive a letter, please call our office to schedule the follow-up appointment.  

## 2014-01-10 NOTE — Assessment & Plan Note (Signed)
The patient has not been experiencing any chest pain or increased shortness of breath.

## 2014-01-10 NOTE — Assessment & Plan Note (Signed)
The patient is on simvastatin.  She is not having any myalgias.  Blood work today is pending.

## 2014-01-10 NOTE — Assessment & Plan Note (Signed)
The patient has not been having any hypoglycemic episodes.  She has not been too careful with her diet.  Weight is up 4 pounds.

## 2014-02-13 ENCOUNTER — Other Ambulatory Visit: Payer: Self-pay | Admitting: Cardiology

## 2014-03-29 ENCOUNTER — Other Ambulatory Visit: Payer: Self-pay | Admitting: Cardiology

## 2014-07-07 ENCOUNTER — Ambulatory Visit: Payer: Medicare Other | Admitting: Cardiology

## 2014-07-10 DIAGNOSIS — H5213 Myopia, bilateral: Secondary | ICD-10-CM | POA: Diagnosis not present

## 2014-07-10 DIAGNOSIS — H35371 Puckering of macula, right eye: Secondary | ICD-10-CM | POA: Diagnosis not present

## 2014-07-10 DIAGNOSIS — H25813 Combined forms of age-related cataract, bilateral: Secondary | ICD-10-CM | POA: Diagnosis not present

## 2014-07-10 DIAGNOSIS — H52223 Regular astigmatism, bilateral: Secondary | ICD-10-CM | POA: Diagnosis not present

## 2014-08-04 ENCOUNTER — Ambulatory Visit (INDEPENDENT_AMBULATORY_CARE_PROVIDER_SITE_OTHER): Payer: Medicare Other | Admitting: Cardiology

## 2014-08-04 ENCOUNTER — Encounter: Payer: Self-pay | Admitting: Cardiology

## 2014-08-04 VITALS — BP 140/80 | HR 50 | Ht 65.0 in | Wt 229.2 lb

## 2014-08-04 DIAGNOSIS — E7800 Pure hypercholesterolemia, unspecified: Secondary | ICD-10-CM

## 2014-08-04 DIAGNOSIS — I119 Hypertensive heart disease without heart failure: Secondary | ICD-10-CM

## 2014-08-04 DIAGNOSIS — I259 Chronic ischemic heart disease, unspecified: Secondary | ICD-10-CM | POA: Diagnosis not present

## 2014-08-04 DIAGNOSIS — E11341 Type 2 diabetes mellitus with severe nonproliferative diabetic retinopathy with macular edema: Secondary | ICD-10-CM

## 2014-08-04 DIAGNOSIS — E113419 Type 2 diabetes mellitus with severe nonproliferative diabetic retinopathy with macular edema, unspecified eye: Secondary | ICD-10-CM

## 2014-08-04 LAB — HEPATIC FUNCTION PANEL
ALT: 15 U/L (ref 0–35)
AST: 15 U/L (ref 0–37)
Albumin: 4.1 g/dL (ref 3.5–5.2)
Alkaline Phosphatase: 89 U/L (ref 39–117)
Bilirubin, Direct: 0.1 mg/dL (ref 0.0–0.3)
Total Bilirubin: 0.3 mg/dL (ref 0.2–1.2)
Total Protein: 7.7 g/dL (ref 6.0–8.3)

## 2014-08-04 LAB — BASIC METABOLIC PANEL WITH GFR
BUN: 23 mg/dL (ref 6–23)
CO2: 30 meq/L (ref 19–32)
Calcium: 9.8 mg/dL (ref 8.4–10.5)
Chloride: 101 meq/L (ref 96–112)
Creatinine, Ser: 0.94 mg/dL (ref 0.40–1.20)
GFR: 76.05 mL/min
Glucose, Bld: 149 mg/dL — ABNORMAL HIGH (ref 70–99)
Potassium: 4.3 meq/L (ref 3.5–5.1)
Sodium: 139 meq/L (ref 135–145)

## 2014-08-04 LAB — LIPID PANEL
CHOL/HDL RATIO: 4
Cholesterol: 148 mg/dL (ref 0–200)
HDL: 35.2 mg/dL — ABNORMAL LOW (ref >39.00)
LDL Cholesterol: 91 mg/dL (ref 0–99)
NonHDL: 112.8
Triglycerides: 108 mg/dL (ref 0.0–149.0)
VLDL: 21.6 mg/dL (ref 0.0–40.0)

## 2014-08-04 LAB — HEMOGLOBIN A1C: HEMOGLOBIN A1C: 7.7 % — AB (ref 4.6–6.5)

## 2014-08-04 NOTE — Patient Instructions (Signed)
Medication Instructions:  Your physician recommends that you continue on your current medications as directed. Please refer to the Current Medication list given to you today.  Labwork: Lp/bmet/hfp/a1c  Testing/Procedures: none  Follow-Up: Your physician wants you to follow-up in: 6 months with fasting labs (lp/bmet/hfp/a1c)  You will receive a reminder letter in the mail two months in advance. If you don't receive a letter, please call our office to schedule the follow-up appointment.    

## 2014-08-04 NOTE — Progress Notes (Signed)
Cardiology Office Note   Date:  08/04/2014   ID:  Haley SporeLena Curtis, DOB [birth year], MRN [id number]  PCP:  No primary care provider on file.  Cardiologist: Cassell Clementhomas Cleo Santucci MD  No chief complaint on file.     History of Present Illness: Haley Curtis is a 69 y.o. female who presents for a six-month follow-up office visit.   She has a history of known coronary artery disease. She had previous coronary artery bypass graft surgery in 2002 . She has had a history of hypertension and hypercholesterolemia. Since last visit she has been doing better in terms of her medical issues. She has been trying to lose weight.Her weight is down 1 pound since last visit. She has not been experiencing any chest pain or shortness of breath.  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  She has not been experiencing any dizziness or syncope.  No palpitations.  She has had one episode of hypoglycemia which occurred when she skipped a meal.  She is not having any symptoms of diabetic neuropathy.  Past Medical History  Diagnosis Date  . Coronary artery disease   . Hypertension   . Hyperlipidemia     Past Surgical History  Procedure Laterality Date  . Cardiac catheterization  05/22/2000    ef 60%  . Coronary artery bypass graft    . [place] echocardiography  03/10/2007    EF 55-60%     Current Outpatient Prescriptions  Medication Sig Dispense Refill  . aspirin 81 MG tablet Take 81 mg by mouth daily.    . hydrochlorothiazide (HYDRODIURIL) 25 MG tablet TAKE 1/2 TABLET BY MOUTH DAILY 90 tablet 2  . metFORMIN (GLUCOPHAGE) 500 MG tablet TAKE 1 TABLET (500 MG TOTAL) BY MOUTH 2 (TWO) TIMES DAILY WITH A MEAL. (NEW DOSE) 60 tablet 5  . metoprolol succinate (TOPROL-XL) 25 MG 24 hr tablet TAKE 1 TABLET (25 MG TOTAL) BY MOUTH DAILY. 30 tablet 11  . Multiple Vitamin (MULTIVITAMIN) capsule Take 1 capsule by mouth daily.    . simvastatin (ZOCOR) 40 MG tablet TAKE 1 TABLET (40 MG  TOTAL) BY MOUTH AT BEDTIME. 90 tablet 3   No current facility-administered medications for this visit.    Allergies:   Review of patient's allergies indicates no known allergies.    Social History:  The patient  reports that she quit smoking about 14 years ago. She does not have any smokeless tobacco history on file. She reports that she does not drink alcohol or use illicit drugs.   Family History:  The patient's family history includes Heart attack in her brother and father; Hypertension in her mother; Stroke in her mother.    ROS:  Please see the history of present illness.   Otherwise, review of systems are positive for none.   All other systems are reviewed and negative.    PHYSICAL EXAM: VS:  BP 140/80 mmHg  Pulse 50  Ht 5\' 5"  (1.651 m)  Wt 229 lb 3.2 oz (103.964 kg)  BMI 38.14 kg/m2 , BMI Body mass index is 38.14 kg/(m^2). GEN: Well nourished, well developed, in no acute distress HEENT: normal Neck: no JVD, carotid bruits, or masses Cardiac: RRR; no murmurs, rubs, or gallops,no edema  Respiratory:  clear to auscultation bilaterally, normal work of breathing GI: soft, nontender, nondistended, + BS MS: no deformity or atrophy Skin: warm and dry, no rash Neuro:  Strength and sensation are intact Psych: euthymic mood, full affect  EKG:  EKG is not ordered today.    Recent Labs: 01/10/2014: ALT 18; BUN 17; Creatinine, Ser 0.9; Potassium 3.5; Sodium 139    Lipid Panel    Component Value Date/Time   CHOL 146 01/10/2014 0940   TRIG 129.0 01/10/2014 0940   HDL 31.40* 01/10/2014 0940   CHOLHDL 5 01/10/2014 0940   VLDL 25.8 01/10/2014 0940   LDLCALC 89 01/10/2014 0940   LDLDIRECT 183.5 10/15/2012 1520      Wt Readings from Last 3 Encounters:  08/04/14 229 lb 3.2 oz (103.964 kg)  01/10/14 230 lb (104.327 kg)  07/05/13 226 lb (102.513 kg)        ASSESSMENT AND PLAN:  1. essential hypertension 2. ischemic heart disease status post CABG 2002 3. diabetes  mellitus type 2 4. Hypercholesterolemia   Current medicines are reviewed at length with the patient today.  The patient does not have concerns regarding medicines.  The following changes have been made:  no change  Labs/ tests ordered today include:   Orders Placed This Encounter  Procedures  . Lipid panel  . Hepatic function panel  . Basic metabolic panel  . Lipid panel  . Hepatic function panel  . Basic metabolic panel  . Hemoglobin A1c  . Hemoglobin A1c    Disposition: Lab work today is pending.  She will continue current medication.  Continue to try to lose weight.  Recheck in 6 months for office visit EKG lipid panel hepatic function panel basal metabolic panel and A1c  Signed, Cassell Clement MD 08/04/2014 11:16 AM    [facility] [address] [place], [place], [place]  [address] Phone: [phone]; Fax: [phone]

## 2014-08-07 NOTE — Progress Notes (Signed)
Quick Note:  Please report to patient. The recent labs are stable. Continue same medication and careful diet. HgbA1C is 7.7 slightly better. ______

## 2014-08-18 ENCOUNTER — Other Ambulatory Visit: Payer: Self-pay | Admitting: Cardiology

## 2014-09-18 ENCOUNTER — Other Ambulatory Visit: Payer: Self-pay

## 2014-09-18 DIAGNOSIS — H35371 Puckering of macula, right eye: Secondary | ICD-10-CM | POA: Diagnosis not present

## 2014-09-18 DIAGNOSIS — H524 Presbyopia: Secondary | ICD-10-CM | POA: Diagnosis not present

## 2014-09-18 DIAGNOSIS — H25011 Cortical age-related cataract, right eye: Secondary | ICD-10-CM | POA: Diagnosis not present

## 2014-09-18 DIAGNOSIS — H25041 Posterior subcapsular polar age-related cataract, right eye: Secondary | ICD-10-CM | POA: Diagnosis not present

## 2014-09-18 DIAGNOSIS — H2512 Age-related nuclear cataract, left eye: Secondary | ICD-10-CM | POA: Diagnosis not present

## 2014-09-18 DIAGNOSIS — H2511 Age-related nuclear cataract, right eye: Secondary | ICD-10-CM | POA: Diagnosis not present

## 2014-09-18 DIAGNOSIS — Z8669 Personal history of other diseases of the nervous system and sense organs: Secondary | ICD-10-CM | POA: Diagnosis not present

## 2014-09-18 MED ORDER — METFORMIN HCL 500 MG PO TABS: ORAL_TABLET | ORAL | Status: DC

## 2014-10-03 DIAGNOSIS — H2511 Age-related nuclear cataract, right eye: Secondary | ICD-10-CM | POA: Diagnosis not present

## 2014-10-04 DIAGNOSIS — H2512 Age-related nuclear cataract, left eye: Secondary | ICD-10-CM | POA: Diagnosis not present

## 2014-10-24 DIAGNOSIS — H20012 Primary iridocyclitis, left eye: Secondary | ICD-10-CM | POA: Diagnosis not present

## 2014-10-24 DIAGNOSIS — Z961 Presence of intraocular lens: Secondary | ICD-10-CM | POA: Diagnosis not present

## 2014-10-24 DIAGNOSIS — H2512 Age-related nuclear cataract, left eye: Secondary | ICD-10-CM | POA: Diagnosis not present

## 2014-10-25 DIAGNOSIS — H20012 Primary iridocyclitis, left eye: Secondary | ICD-10-CM | POA: Diagnosis not present

## 2014-10-25 DIAGNOSIS — H2512 Age-related nuclear cataract, left eye: Secondary | ICD-10-CM | POA: Diagnosis not present

## 2014-10-25 DIAGNOSIS — Z961 Presence of intraocular lens: Secondary | ICD-10-CM | POA: Diagnosis not present

## 2014-10-27 DIAGNOSIS — H20012 Primary iridocyclitis, left eye: Secondary | ICD-10-CM | POA: Diagnosis not present

## 2014-10-27 DIAGNOSIS — Z961 Presence of intraocular lens: Secondary | ICD-10-CM | POA: Diagnosis not present

## 2014-11-04 ENCOUNTER — Other Ambulatory Visit: Payer: Self-pay | Admitting: Cardiology

## 2014-11-21 ENCOUNTER — Other Ambulatory Visit: Payer: Self-pay | Admitting: *Deleted

## 2014-11-21 MED ORDER — HYDROCHLOROTHIAZIDE 25 MG PO TABS: ORAL_TABLET | ORAL | Status: DC

## 2014-11-21 NOTE — Telephone Encounter (Signed)
Received fax from [facility] [address] to refill patients hctz. Rx sent.

## 2014-12-01 ENCOUNTER — Other Ambulatory Visit: Payer: Self-pay | Admitting: Cardiology

## 2015-01-21 NOTE — Progress Notes (Signed)
Cardiology Office Note   Date:  01/23/2015   ID:  Haley Curtis, DOB 05/16/1945, MRN [id number]  PCP:  No PCP Per Patient  Cardiologist:  Dr. Patty Sermons   6 month follow up    History of Present Illness: Haley Curtis is a 69 y.o. female with a history of HTN, HLD, CAD s/p CABG 2002, obesity and DM who presents to clinic for routine follow up.    She was last sen by Dr. Patty Sermons in 08/2014 and felt to be stable from a cardiac standpoint. He recommended 6 month follow up with ECG, lipid panel, hepatic panel, BMP and A1c . Today she presents for follow up.   No CP, SOB or palpitations. No dizziness or syncope. No LE edema, orthopnea, PND. She rides a bike for 30-60 minutes a day with no exertional symptoms. 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 She is feeling very well and has no complaints. She is trying to watch her diet but hard with kids. She is down 7 lbs since last weight in July.    Past Medical History  Diagnosis Date  . Coronary artery disease   . Hypertension   . Hyperlipidemia     Past Surgical History  Procedure Laterality Date  . Cardiac catheterization  05/22/2000    ef 60%  . Coronary artery bypass graft    . US echocardiography  03/10/2007    EF 55-60%     Current Outpatient Prescriptions  Medication Sig Dispense Refill  . aspirin 81 MG tablet Take 81 mg by mouth daily.    . hydrochlorothiazide (HYDRODIURIL) 25 MG tablet Take 25 mg by mouth daily.    . metFORMIN (GLUCOPHAGE) 500 MG tablet TAKE 1 TABLET (500 MG TOTAL) BY MOUTH 2 (TWO) TIMES DAILY WITH A MEAL. (NEW DOSE) 60 tablet 6  . metoprolol succinate (TOPROL-XL) 25 MG 24 hr tablet TAKE 1 TABLET (25 MG TOTAL) BY MOUTH DAILY. 30 tablet 5  . Multiple Vitamin (MULTIVITAMIN) capsule Take 1 capsule by mouth daily.    . simvastatin (ZOCOR) 40 MG tablet TAKE 1 TABLET (40 MG TOTAL) BY MOUTH AT BEDTIME. 90 tablet 0   No current facility-administered medications for this visit.     Allergies:   Review of patient's allergies indicates no known allergies.    Social History:  The patient  reports that she quit smoking about 14 years ago. She does not have any smokeless tobacco history on file. She reports that she does not drink alcohol or use illicit drugs.   Family History:  The patient's family history includes Heart attack in her brother and father; Hypertension in her mother; Stroke in her mother.    ROS:  Please see the history of present illness.   Otherwise, review of systems are positive for none.   All other systems are reviewed and negative.    PHYSICAL EXAM: VS:  BP 140/84 mmHg  Pulse 90  Ht  (1.651 m)  Wt 223 lb 3.2 oz (101.243 kg)  BMI 37.14 kg/m2  SpO2 90% , BMI Body mass index is 37.14 kg/(m^2). GEN: Well nourished, well developed, in no acute distress HEENT: normal Neck: no JVD, carotid bruits, or masses Cardiac: RRR; no murmurs, rubs, or gallops,no edema  Respiratory:  clear to auscultation bilaterally, normal work of breathing GI: soft, nontender, nondistended, + BS MS: no deformity or atrophy Skin: warm and dry, no rash Neuro:  Strength and sensation are  intact Psych: euthymic mood, full affect  EKG:  EKG is ordered today. The ekg ordered today demonstrates HR 93, NSR with non specific ST changes similar to previous    Recent Labs: 08/04/2014: ALT 15; BUN 23; Creatinine, Ser 0.94; Potassium 4.3; Sodium 139    Lipid Panel    Component Value Date/Time   CHOL 148 08/04/2014 0918   TRIG 108.0 08/04/2014 0918   HDL 35.20* 08/04/2014 0918   CHOLHDL 4 08/04/2014 0918   VLDL 21.6 08/04/2014 0918   LDLCALC 91 08/04/2014 0918   LDLDIRECT 183.5 10/15/2012 1520      Wt Readings from Last 3 Encounters:  01/23/15 223 lb 3.2 oz (101.243 kg)  08/04/14 229 lb 3.2 oz (103.964 kg)  01/10/14 230 lb (104.327 kg)      Other studies Reviewed: Additional studies/ records that were reviewed today include: 2d ECHO Review of the above  records demonstrates:   ECHO 2009: mild LVH with impaired relaxation, mild aortic sclerosis   ASSESSMENT AND PLAN: Haley Curtis is a 69 y.o. female with a history of HTN, HLD, CAD s/p CABG 2002, obesity and DM who presents to clinic for routine follow up.    CAD s/p CABG: She was last sen by Dr. Patty SermonsBrackbill in 08/2014 and felt to be stable from a cardiac standpoint. He recommended 6 month follow up with ECG, lipid panel, hepatic panel, BMP and A1c . -- Continue ASA, BB and statin  HTN: BP 140/84. Continue Torprol XL 25mg  and HCTZ 12.5 mg  HLD: continue statin. Will obtain fasting lipids and hepatic panel at earliest convenience  DM: last HgA1c 7.7. She will need better control of her DM. As above will repeat a HgA1c  Obesity: diet and exercise recommended. Down 6 lbs since July.   Current medicines are reviewed at length with the patient today.  The patient does not have concerns regarding medicines.  The following changes have been made:  no change  Labs/ tests ordered today include:   Orders Placed This Encounter  Procedures  . EKG 12-Lead     Disposition:   FU with Dr. Delton SeeNelson in 6 months  Signed, Allena KatzHOMPSON, Caitlynne Harbeck R, PA-C  01/23/2015 10:56 AM    [facility] [address], [place], [place]  [address] Phone: [phone]; Fax: [phone]

## 2015-01-23 ENCOUNTER — Encounter: Payer: Self-pay | Admitting: Physician Assistant

## 2015-01-23 ENCOUNTER — Ambulatory Visit (INDEPENDENT_AMBULATORY_CARE_PROVIDER_SITE_OTHER): Payer: Medicare Other | Admitting: Physician Assistant

## 2015-01-23 ENCOUNTER — Other Ambulatory Visit (INDEPENDENT_AMBULATORY_CARE_PROVIDER_SITE_OTHER): Payer: Medicare Other | Admitting: *Deleted

## 2015-01-23 VITALS — BP 140/84 | HR 90 | Ht 65.0 in | Wt 223.2 lb

## 2015-01-23 DIAGNOSIS — E7800 Pure hypercholesterolemia, unspecified: Secondary | ICD-10-CM

## 2015-01-23 DIAGNOSIS — I259 Chronic ischemic heart disease, unspecified: Secondary | ICD-10-CM

## 2015-01-23 DIAGNOSIS — I119 Hypertensive heart disease without heart failure: Secondary | ICD-10-CM

## 2015-01-23 DIAGNOSIS — E1100 Type 2 diabetes mellitus with hyperosmolarity without nonketotic hyperglycemic-hyperosmolar coma (NKHHC): Secondary | ICD-10-CM

## 2015-01-23 LAB — HEPATIC FUNCTION PANEL
ALT: 18 U/L (ref 6–29)
AST: 18 U/L (ref 10–35)
Albumin: 4.1 g/dL (ref 3.6–5.1)
Alkaline Phosphatase: 72 U/L (ref 33–130)
BILIRUBIN DIRECT: 0.1 mg/dL (ref <=0.2)
BILIRUBIN INDIRECT: 0.3 mg/dL (ref 0.2–1.2)
TOTAL PROTEIN: 7.4 g/dL (ref 6.1–8.1)
Total Bilirubin: 0.4 mg/dL (ref 0.2–1.2)

## 2015-01-23 LAB — BASIC METABOLIC PANEL
BUN: 17 mg/dL (ref 7–25)
CHLORIDE: 98 mmol/L (ref 98–110)
CO2: 27 mmol/L (ref 20–31)
Calcium: 9.6 mg/dL (ref 8.6–10.4)
Creat: 1.06 mg/dL — ABNORMAL HIGH (ref 0.50–0.99)
GLUCOSE: 162 mg/dL — AB (ref 65–99)
POTASSIUM: 3.5 mmol/L (ref 3.5–5.3)
SODIUM: 138 mmol/L (ref 135–146)

## 2015-01-23 LAB — HEMOGLOBIN A1C
HEMOGLOBIN A1C: 8.7 % — AB (ref <5.7)
MEAN PLASMA GLUCOSE: 203 mg/dL — AB (ref <117)

## 2015-01-23 LAB — LIPID PANEL
CHOL/HDL RATIO: 4.7 ratio (ref <=5.0)
Cholesterol: 145 mg/dL (ref 125–200)
HDL: 31 mg/dL — ABNORMAL LOW (ref >=46)
LDL CALC: 89 mg/dL (ref <130)
TRIGLYCERIDES: 125 mg/dL (ref <150)
VLDL: 25 mg/dL (ref <30)

## 2015-01-23 NOTE — Patient Instructions (Signed)
Medication Instructions:  Your physician recommends that you continue on your current medications as directed. Please refer to the Current Medication list given to you today.   Labwork: NONE ORDERED  Testing/Procedures: NONE ORDERED  Follow-Up: Your physician wants you to follow-up in: 6 MONTHS WITH DR. Delton SeeNELSON.  You will receive a reminder letter in the mail two months in advance. If you don't receive a letter, please call our office to schedule the follow-up appointment.   Any Other Special Instructions Will Be Listed Below (If Applicable).     If you need a refill on your cardiac medications before your next appointment, please call your pharmacy.

## 2015-01-23 NOTE — Addendum Note (Signed)
Addended by: Tonita PhoenixBOWDEN, Rodgerick Gilliand K on: 01/23/2015 10:34 AM   Modules accepted: Orders

## 2015-01-24 ENCOUNTER — Other Ambulatory Visit: Payer: Self-pay | Admitting: *Deleted

## 2015-01-24 MED ORDER — METFORMIN HCL 500 MG PO TABS: ORAL_TABLET | ORAL | Status: DC

## 2015-02-07 ENCOUNTER — Other Ambulatory Visit: Payer: Self-pay | Admitting: Cardiology

## 2015-05-09 ENCOUNTER — Other Ambulatory Visit: Payer: Self-pay | Admitting: Cardiology

## 2015-06-19 ENCOUNTER — Telehealth: Payer: Self-pay | Admitting: Cardiology

## 2015-06-19 ENCOUNTER — Telehealth: Payer: Self-pay

## 2015-06-19 MED ORDER — METOPROLOL SUCCINATE ER 25 MG PO TB24: ORAL_TABLET | ORAL | Status: DC

## 2015-06-19 NOTE — Telephone Encounter (Signed)
Yes please fill this medicine accordingly.  This pt has an appt with Dr Delton SeeNelson to establish care with this pt on 08/20/15 0815.  Please give refills up to the date of her re-est appt with Dr Delton SeeNelson on 08/20/15.

## 2015-06-19 NOTE — Telephone Encounter (Signed)
Janetta HoraKathryn R Thompson, PA-C at 01/21/2015 9:43 PM  metoprolol succinate (TOPROL-XL) 25 MG 24 hr tabletTAKE 1 TABLET (25 MG TOTAL) BY MOUTH DAILY Current medicines are reviewed at length with the patient today. The patient does not have concerns regarding medicines.  The following changes have been made: no change

## 2015-06-19 NOTE — Telephone Encounter (Signed)
New message   *STAT* If patient is at the pharmacy, call can be transferred to refill team.   1. Which medications need to be refilled? (please list name of each medication and dose if known) metoprolol succinate (TOPROL-XL) 25 MG 24 hr tablet  2. Which pharmacy/location (including street and city if local pharmacy) is medication to be sent to?  [facility] [address]   3. Do they need a 30 day or 90 day supply? 90 day   Comments: Please call pt back to clarify if this medication is clear because she states that she was a little confused

## 2015-06-20 MED ORDER — METOPROLOL SUCCINATE ER 25 MG PO TB24: ORAL_TABLET | ORAL | Status: DC

## 2015-06-20 NOTE — Telephone Encounter (Signed)
Called pt to inform her that Dr. Delton SeeNelson approve for pt's medication of Metoprolol 25 mg to be refill up until her appt on 08/20/15. I advised the pt that I was sending in a refill for 30 days with 1 refill and that it was enough until her appt with Dr. Delton SeeNelson and that her refill was being sent to her pharmacy as requested. I advised the pt that if she has any other problems, questions or concerns to call our office. Pt verbalized understanding.

## 2015-08-20 ENCOUNTER — Ambulatory Visit (INDEPENDENT_AMBULATORY_CARE_PROVIDER_SITE_OTHER): Payer: Medicare Other | Admitting: Cardiology

## 2015-08-20 ENCOUNTER — Encounter: Payer: Self-pay | Admitting: Cardiology

## 2015-08-20 ENCOUNTER — Encounter (INDEPENDENT_AMBULATORY_CARE_PROVIDER_SITE_OTHER): Payer: Self-pay

## 2015-08-20 VITALS — BP 140/88 | HR 80 | Ht 65.0 in | Wt 228.2 lb

## 2015-08-20 DIAGNOSIS — E785 Hyperlipidemia, unspecified: Secondary | ICD-10-CM | POA: Diagnosis not present

## 2015-08-20 DIAGNOSIS — I2510 Atherosclerotic heart disease of native coronary artery without angina pectoris: Secondary | ICD-10-CM | POA: Diagnosis not present

## 2015-08-20 DIAGNOSIS — Z0000 Encounter for general adult medical examination without abnormal findings: Secondary | ICD-10-CM | POA: Diagnosis not present

## 2015-08-20 DIAGNOSIS — Z79899 Other long term (current) drug therapy: Secondary | ICD-10-CM

## 2015-08-20 MED ORDER — SIMVASTATIN 40 MG PO TABS: 40.0000 mg | ORAL_TABLET | Freq: Every day | ORAL | Status: DC

## 2015-08-20 MED ORDER — METOPROLOL SUCCINATE ER 25 MG PO TB24: ORAL_TABLET | ORAL | Status: DC

## 2015-08-20 MED ORDER — HYDROCHLOROTHIAZIDE 25 MG PO TABS
12.5000 mg | ORAL_TABLET | Freq: Every day | ORAL | Status: DC
Start: 2015-08-20 — End: 2016-07-01

## 2015-08-20 NOTE — Progress Notes (Signed)
Cardiology Office Note  Date:  08/20/2015   ID:  Haley Curtis, DOB [birth year], MRN [id number]  PCP:  No PCP Per Patient  Cardiologist:  Dr. Patty Sermons   6 month follow up  History of Present Illness: Haley Curtis is a 70 y.o. female with a history of HTN, HLD, CAD s/p CABG 2002, obesity and DM who presents to clinic for routine follow up.  She was last sen by Dr. Patty Sermons in 08/2014 and felt to be stable from a cardiac standpoint. He recommended 6 month follow up with ECG, lipid panel, hepatic panel, BMP and A1c . Today she presents for follow up.   No CP, SOB or palpitations. No dizziness or syncope. No LE edema, orthopnea, PND. She rides a bike for 30-60 minutes a day with no exertional symptoms. She babysits 1 and 2 yos and she is running up and down steps all day. She is feeling very well and has no complaints. She is really doing well other than having multiple eye surgery including glaucoma and cataract.  Past Medical History  Diagnosis Date  . Coronary artery disease   . Hypertension   . Hyperlipidemia    Past Surgical History  Procedure Laterality Date  . Cardiac catheterization  05/22/2000    ef 60%  . Coronary artery bypass graft    . US echocardiography  03/10/2007    EF 55-60%   Current Outpatient Prescriptions  Medication Sig Dispense Refill  . aspirin 81 MG tablet Take 81 mg by mouth daily.    . hydrochlorothiazide (HYDRODIURIL) 25 MG tablet Take 0.5 tablets (12.5 mg total) by mouth daily. 45 tablet 3  . metFORMIN (GLUCOPHAGE) 500 MG tablet Take 2 tablets every morning and one tablet every evening 90 tablet 6  . metoprolol succinate (TOPROL-XL) 25 MG 24 hr tablet TAKE 1 TABLET (25 MG TOTAL) BY MOUTH DAILY. 90 tablet 3  . Multiple Vitamin (MULTIVITAMIN) capsule Take 1 capsule by mouth daily.    . simvastatin (ZOCOR) 40 MG tablet Take 1 tablet (40 mg total) by mouth daily at 6 PM. 90 tablet 3   No current facility-administered medications for this visit.    Allergies:   Review of patient's allergies indicates no known allergies.   Social History:  The patient  reports that she quit smoking about 15 years ago. She does not have any smokeless tobacco history on file. She reports that she does not drink alcohol or use illicit drugs.   Family History:  The patient's family history includes Heart attack in her brother and father; Hypertension in her mother; Stroke in her mother.   ROS:  Please see the history of present illness.   Otherwise, review of systems are positive for none.   All other systems are reviewed and negative.   PHYSICAL EXAM: VS:  BP 140/88 mmHg  Pulse 80  Ht  (1.651 m)  Wt 228 lb 3.2 oz (103.511 kg)  BMI 37.97 kg/m2 , BMI Body mass index is 37.97 kg/(m^2). GEN: Well nourished, well developed, in no acute distress HEENT: normal Neck: no JVD, carotid bruits, or masses Cardiac: RRR; no murmurs, rubs, or gallops,no edema  Respiratory:  clear to auscultation bilaterally, normal work of breathing GI: soft, nontender, nondistended, + BS MS: no deformity or atrophy Skin: warm and dry, no rash Neuro:  Strength and sensation are intact Psych: euthymic mood, full affect  EKG:  EKG is ordered today. The ekg ordered today demonstrates HR 93, NSR with non specific  ST changes similar to previous   Recent Labs: 01/23/2015: ALT 18; BUN 17; Creat 1.06*; Potassium 3.5; Sodium 138   Lipid Panel    Component Value Date/Time   CHOL 145 01/23/2015 1034   TRIG 125 01/23/2015 1034   HDL 31* 01/23/2015 1034   CHOLHDL 4.7 01/23/2015 1034   VLDL 25 01/23/2015 1034   LDLCALC 89 01/23/2015 1034   LDLDIRECT 183.5 10/15/2012 1520   Wt Readings from Last 3 Encounters:  08/20/15 228 lb 3.2 oz (103.511 kg)  01/23/15 223 lb 3.2 oz (101.243 kg)  08/04/14 229 lb 3.2 oz (103.964 kg)    Other studies Reviewed: Additional studies/ records that were reviewed today include: 2d ECHO Review of the above records demonstrates:   ECHO 2009:  mild LVH with impaired relaxation, mild aortic sclerosis   ASSESSMENT AND PLAN: Haley Curtis is a 70 y.o. female with a history of HTN, HLD, CAD s/p CABG 2002, obesity and DM who presents to clinic for routine follow up.    CAD s/p CABG: She was last sen by Dr. Patty SermonsBrackbill in 08/2014 and felt to be stable from a cardiac standpoint. He recommended 6 month follow up with ECG, lipid panel, hepatic panel, BMP and A1c . -- Continue ASA, BB and statin  HTN: BP 140/84. Continue Torprol XL 25mg  and HCTZ 12.5 mg, She states that her blood pressure is significantly better at home plus she ran out of hydrochlorothiazide and we will refill.  HLD: continue statin. Her most recent lipid profile showed old lipids at goal including LDL 89, triglycerides 125.  DM: last HgA1c 8.7. She will need better control of her DM. As above will repeat a HgA1c  Obesity: diet and exercise recommended. Down 6 lbs since July.   The following changes have been made:  no change  Disposition:   FU with Dr. Delton SeeNelson in 6 months, We will order the meds, hemoglobin A1c, liver enzyme lipid prior to next appointment.  Signed, Tobias AlexanderKatarina Maddux First, MD  08/20/2015 2:44 PM    [facility] [address], [place], [place]  [address] Phone: [phone]; Fax: [phone]) [phone]

## 2015-08-20 NOTE — Patient Instructions (Addendum)
Medication Instructions:  Your physician recommends that you continue on your current medications as directed. Please refer to the Current Medication list given to you today.   Labwork: PRIORTO  6 MO  VISIT  BMET ,HGB A1C, LIVER,  AND LIPID  Testing/Procedures: NONE  Follow-Up:  Your physician wants you to follow-up in:   6 MONTHS  WITH  DR  Johnell ComingsNELSON You will receive a reminder letter in the mail two months in advance. If you don't receive a letter, please call our office to schedule the follow-up appointment.   Any Other Special Instructions Will Be Listed Below (If Applicable).     If you need a refill on your cardiac medications before your next appointment, please call your pharmacy.

## 2015-10-15 ENCOUNTER — Other Ambulatory Visit: Payer: Self-pay | Admitting: Cardiology

## 2015-10-15 NOTE — Telephone Encounter (Signed)
Pt calling requesting a refill on Metformin 500 mg tablet, former Dr. Patty SermonsBrackbill pt. Would you like to refill this medication? Please advise

## 2015-10-16 NOTE — Telephone Encounter (Signed)
Will route this to Dr Delton SeeNelson to review and advise on refill request of Metformin.  Pt does need to obtain a PCP for refills of non-cardiac meds

## 2015-10-16 NOTE — Telephone Encounter (Signed)
Dr Delton SeeNelson will need to advise on this.  Could you please tell her to find a PCP, for Dr Patty SermonsBrackbill instructed her to do so when he retired? Dr Delton SeeNelson will probably only fill this x 1.  But 100% she will need to find a PCP to follow her diabetes.  Thanks

## 2015-10-16 NOTE — Telephone Encounter (Signed)
Patient called again and requested a refill on metformin. She stated that she does not currently have a pcp. Please advise. Thanks, MI

## 2015-10-17 NOTE — Telephone Encounter (Signed)
PER IVY, INFORMED PT THAT SHE WAS GOING TO HAVE TO FIND A PCP AS ADVISED BY DR BRACKBILL, PT EXPRESSED UNDERSTANDING

## 2015-10-25 ENCOUNTER — Encounter: Payer: Self-pay | Admitting: Family Medicine

## 2015-10-25 ENCOUNTER — Telehealth: Payer: Self-pay | Admitting: General Practice

## 2015-10-25 ENCOUNTER — Ambulatory Visit (INDEPENDENT_AMBULATORY_CARE_PROVIDER_SITE_OTHER): Payer: Medicare Other | Admitting: Family Medicine

## 2015-10-25 VITALS — BP 132/96 | HR 98 | Temp 98.4°F | Ht 64.5 in | Wt 218.0 lb

## 2015-10-25 DIAGNOSIS — E118 Type 2 diabetes mellitus with unspecified complications: Secondary | ICD-10-CM | POA: Diagnosis not present

## 2015-10-25 DIAGNOSIS — Z23 Encounter for immunization: Secondary | ICD-10-CM

## 2015-10-25 DIAGNOSIS — E785 Hyperlipidemia, unspecified: Secondary | ICD-10-CM | POA: Diagnosis not present

## 2015-10-25 DIAGNOSIS — I10 Essential (primary) hypertension: Secondary | ICD-10-CM

## 2015-10-25 DIAGNOSIS — Z1239 Encounter for other screening for malignant neoplasm of breast: Secondary | ICD-10-CM

## 2015-10-25 DIAGNOSIS — I2510 Atherosclerotic heart disease of native coronary artery without angina pectoris: Secondary | ICD-10-CM | POA: Diagnosis not present

## 2015-10-25 MED ORDER — METFORMIN HCL 1000 MG PO TABS: 1000.0000 mg | ORAL_TABLET | Freq: Two times a day (BID) | ORAL | 1 refills | Status: DC

## 2015-10-25 MED ORDER — SIMVASTATIN 80 MG PO TABS: 80.0000 mg | ORAL_TABLET | Freq: Every day | ORAL | 3 refills | Status: DC

## 2015-10-25 NOTE — Progress Notes (Signed)
Subjective:  Patient ID: Haley Curtis, female    DOB: 1945-08-11  Age: 70 y.o. MRN: [id number]  CC: Establish care  HPI Haley Curtis is a 70 y.o. female presents to the clinic today to establish care. Issues are below.  CAD  S/p Bypass.  Stable. No recent chest pain or shortness of breath.  Compliant with aspirin, statin, metoprolol.  HTN  Stable on metoprolol and HCTZ.  HLD  Not at goal (would recommend LDL of less than 70).  Currently on simvastatin. Will discuss today.  DM-2  Patient has been off of her metformin for the past 2 weeks as she has been out. Next line she states that her blood sugars have been in the 150s to 160s.  Her most recent A1c was 8.7 at the end of last year.  She is in need a refill on her metformin.  She is in need of A1c today.  PMH, Surgical Hx, Family Hx, Social History reviewed and updated as below.  Past Medical History:  Diagnosis Date  . Chicken pox   . Coronary artery disease   . Diabetes mellitus without complication ([place])   . Hyperlipidemia   . Hypertension   . Urine incontinence    Past Surgical History:  Procedure Laterality Date  . ABDOMINAL HYSTERECTOMY    . CARDIAC CATHETERIZATION  05/22/2000   ef 60%  . CORONARY ARTERY BYPASS GRAFT    . US ECHOCARDIOGRAPHY  03/10/2007   EF 55-60%   Family History  Problem Relation Age of Onset  . Hypertension Mother   . Stroke Mother   . Diabetes Mother   . Heart attack Father   . Hypertension Father   . Heart attack Brother    Social History  Substance Use Topics  . Smoking status: Former Smoker    Quit date: 02/04/2000  . Smokeless tobacco: Never Used  . Alcohol use Yes   Review of Systems General: Denies unexplained weight loss, fever. Skin: Denies new or changing mole, sore/wound that won't heal. ENT: Trouble hearing, ringing in the ears, sores in the mouth, hoarseness, trouble swallowing. Eyes: Denies trouble seeing/visual disturbance. Heart/CV: Denies chest  pain, shortness of breath, edema, palpitations. Lungs/Resp: Denies cough, shortness of breath, hemoptysis. Abd/GI: Denies nausea, vomiting, diarrhea, constipation, abdominal pain, hematochezia, melena. GU: Denies dysuria, incontinence, hematuria, urinary frequency, difficulty starting/keeping stream, vaginal discharge, sexual difficulty, lump in breasts. MSK: Denies joint pain/swelling, myalgias. Neuro: Denies headaches, weakness, numbness, dizziness, syncope. Psych: Denies sadness, anxiety, stress, memory difficulty. Endocrine: Denies polyuria and polydipsia.  Objective:   Today's Vitals: BP (!) 132/96 (BP Location: Right Arm, Patient Position: Sitting, Cuff Size: Large)   Pulse 98   Temp 98.4 F (36.9 C) (Oral)   Ht 5' 4.5" (1.638 m)   Wt 218 lb (98.9 kg)   SpO2 95%   BMI 36.84 kg/m   Physical Exam  Constitutional: She is oriented to person, place, and time. She appears well-developed and well-nourished. No distress.  HENT:  Head: Normocephalic and atraumatic.  Nose: Nose normal.  Mouth/Throat: Oropharynx is clear and moist. No oropharyngeal exudate.  Normal TM's bilaterally.   Eyes: Conjunctivae are normal. No scleral icterus.  Neck: Neck supple. No thyromegaly present.  Cardiovascular: Normal rate and regular rhythm.   No murmur heard. Pulmonary/Chest: Effort normal and breath sounds normal. She has no wheezes. She has no rales.  Abdominal: Soft. She exhibits no distension. There is no tenderness. There is no rebound and no guarding.  Musculoskeletal: Normal range of  motion. She exhibits no edema.  Lymphadenopathy:    She has no cervical adenopathy.  Neurological: She is alert and oriented to person, place, and time.  Skin: Skin is warm and dry. No rash noted.  Psychiatric: She has a normal mood and affect.  Vitals reviewed.  Assessment & Plan:   Problem List Items Addressed This Visit    CAD (coronary artery disease) - Primary    Stable. Continue aspirin,  metoprolol, and statin (see HLD regarding statin recommendations).      Relevant Medications   simvastatin (ZOCOR) 80 MG tablet   Other Relevant Orders   CBC (Completed)   DM (diabetes mellitus), type 2 with complications ([place])    Most recent A1c was uncontrolled. A1c today. Referring metformin and increasing to 1000 mg twice daily.      Relevant Medications   metFORMIN (GLUCOPHAGE) 1000 MG tablet   simvastatin (ZOCOR) 80 MG tablet   Other Relevant Orders   Comp Met (CMET) (Completed)   HgB A1c (Completed)   Urine Microalbumin w/creat. ratio (Completed)   Essential hypertension    Stable/well controlled. Continue metoprolol and HCTZ.      Relevant Medications   simvastatin (ZOCOR) 80 MG tablet   Hyperlipidemia    Not a goal. I suggested switching to a high intensity statin and patient seemed reluctant. Will increase simvastatin to 80 mg daily.      Relevant Medications   simvastatin (ZOCOR) 80 MG tablet   Other Relevant Orders   Lipid Profile (Completed)    Other Visit Diagnoses    Screening for breast cancer       Relevant Orders   MM DIGITAL SCREENING BILATERAL      Outpatient Encounter Prescriptions as of 10/25/2015  Medication Sig  . aspirin 81 MG tablet Take 81 mg by mouth daily.  . hydrochlorothiazide (HYDRODIURIL) 25 MG tablet Take 0.5 tablets (12.5 mg total) by mouth daily.  . metFORMIN (GLUCOPHAGE) 1000 MG tablet Take 1 tablet (1,000 mg total) by mouth 2 (two) times daily with a meal. Take 2 tablets every morning and one tablet every evening  . metoprolol succinate (TOPROL-XL) 25 MG 24 hr tablet TAKE 1 TABLET (25 MG TOTAL) BY MOUTH DAILY.  . Multiple Vitamin (MULTIVITAMIN) capsule Take 1 capsule by mouth daily.  . simvastatin (ZOCOR) 80 MG tablet Take 1 tablet (80 mg total) by mouth daily.  . [DISCONTINUED] metFORMIN (GLUCOPHAGE) 500 MG tablet Take 2 tablets every morning and one tablet every evening  . [DISCONTINUED] simvastatin (ZOCOR) 40 MG tablet Take  1 tablet (40 mg total) by mouth daily at 6 PM.   No facility-administered encounter medications on file as of 10/25/2015.     Follow-up: Return in about 3 months (around 01/24/2016).  [place]

## 2015-10-25 NOTE — Progress Notes (Signed)
Pre visit review using our clinic review tool, if applicable. No additional management support is needed unless otherwise documented below in the visit note. 

## 2015-10-25 NOTE — Patient Instructions (Signed)
It was nice to see you today.  I have increased your metformin and the simvastatin.  Follow up in 3 months.  Take care  Dr. Adriana Simasook

## 2015-10-26 ENCOUNTER — Telehealth: Payer: Self-pay | Admitting: *Deleted

## 2015-10-26 LAB — COMPREHENSIVE METABOLIC PANEL
ALT: 16 U/L (ref 0–35)
AST: 16 U/L (ref 0–37)
Albumin: 4.1 g/dL (ref 3.5–5.2)
Alkaline Phosphatase: 83 U/L (ref 39–117)
BILIRUBIN TOTAL: 0.2 mg/dL (ref 0.2–1.2)
BUN: 23 mg/dL (ref 6–23)
CALCIUM: 9.4 mg/dL (ref 8.4–10.5)
CHLORIDE: 102 meq/L (ref 96–112)
CO2: 30 meq/L (ref 19–32)
Creatinine, Ser: 1 mg/dL (ref 0.40–1.20)
GFR: 70.56 mL/min (ref >60.00)
GLUCOSE: 119 mg/dL — AB (ref 70–99)
Potassium: 4.1 mEq/L (ref 3.5–5.1)
Sodium: 141 mEq/L (ref 135–145)
Total Protein: 7.8 g/dL (ref 6.0–8.3)

## 2015-10-26 LAB — LIPID PANEL
CHOL/HDL RATIO: 4
Cholesterol: 158 mg/dL (ref 0–200)
HDL: 37.6 mg/dL — AB (ref >39.00)
LDL CALC: 91 mg/dL (ref 0–99)
NONHDL: 120.14
TRIGLYCERIDES: 147 mg/dL (ref 0.0–149.0)
VLDL: 29.4 mg/dL (ref 0.0–40.0)

## 2015-10-26 LAB — HEMOGLOBIN A1C: Hgb A1c MFr Bld: 7.2 % — ABNORMAL HIGH (ref 4.6–6.5)

## 2015-10-26 LAB — CBC
HCT: 38.1 % (ref 36.0–46.0)
HEMOGLOBIN: 12.6 g/dL (ref 12.0–15.0)
MCHC: 33.1 g/dL (ref 30.0–36.0)
MCV: 89 fl (ref 78.0–100.0)
Platelets: 282 10*3/uL (ref 150.0–400.0)
RBC: 4.28 Mil/uL (ref 3.87–5.11)
RDW: 14.7 % (ref 11.5–15.5)
WBC: 7.2 10*3/uL (ref 4.0–10.5)

## 2015-10-26 LAB — MICROALBUMIN / CREATININE URINE RATIO
Creatinine,U: 188.3 mg/dL
Microalb Creat Ratio: 2.8 mg/g (ref 0.0–30.0)
Microalb, Ur: 5.2 mg/dL — ABNORMAL HIGH (ref 0.0–1.9)

## 2015-10-26 NOTE — Assessment & Plan Note (Signed)
Not a goal. I suggested switching to a high intensity statin and patient seemed reluctant. Will increase simvastatin to 80 mg daily.

## 2015-10-26 NOTE — Telephone Encounter (Signed)
Clarified with the patient's daughter, thanks

## 2015-10-26 NOTE — Assessment & Plan Note (Signed)
Stable. Continue aspirin, metoprolol, and statin (see HLD regarding statin recommendations).

## 2015-10-26 NOTE — Telephone Encounter (Signed)
It is correct the way it was prescribed. I discussed this with the patient. Diabetes uncontrolled.

## 2015-10-26 NOTE — Assessment & Plan Note (Signed)
Most recent A1c was uncontrolled. A1c today. Referring metformin and increasing to 1000 mg twice daily.

## 2015-10-26 NOTE — Telephone Encounter (Signed)
Patient daughter stated that she had a question in regards to the metformin directions ,the medication has two different directions.  Contact [name] [phone]

## 2015-10-26 NOTE — Assessment & Plan Note (Signed)
Stable/well controlled. Continue metoprolol and HCTZ.

## 2015-10-26 NOTE — Telephone Encounter (Signed)
Daughter concerned that her mom's metformin dosing is not correct, patient was on 500mg  twice daily prior to yesterday, dose was increased to 1000mg  2 tablets in am and one in PM, is the 1000mg  correct or is it suppose to be 500mg ? thanks

## 2015-11-02 ENCOUNTER — Ambulatory Visit: Payer: Medicare Other | Admitting: Family Medicine

## 2015-11-13 ENCOUNTER — Other Ambulatory Visit: Payer: Self-pay | Admitting: Family Medicine

## 2015-11-13 ENCOUNTER — Ambulatory Visit
Admission: RE | Admit: 2015-11-13 | Discharge: 2015-11-13 | Disposition: A | Discharge status: Home / Self Care | Payer: Medicare Other | Source: Ambulatory Visit | Attending: Family Medicine | Admitting: Family Medicine

## 2015-11-13 DIAGNOSIS — Z1231 Encounter for screening mammogram for malignant neoplasm of breast: Secondary | ICD-10-CM | POA: Diagnosis not present

## 2015-11-13 DIAGNOSIS — Z1239 Encounter for other screening for malignant neoplasm of breast: Secondary | ICD-10-CM

## 2015-11-14 ENCOUNTER — Ambulatory Visit: Payer: Medicare Other | Admitting: Family Medicine

## 2016-01-01 ENCOUNTER — Telehealth: Payer: Self-pay | Admitting: Family Medicine

## 2016-01-01 NOTE — Telephone Encounter (Signed)
Pt declined to get a AWV. Thank you! °

## 2016-01-10 ENCOUNTER — Other Ambulatory Visit: Payer: Self-pay

## 2016-02-14 ENCOUNTER — Ambulatory Visit (INDEPENDENT_AMBULATORY_CARE_PROVIDER_SITE_OTHER): Payer: Medicare Other | Admitting: Family Medicine

## 2016-02-14 ENCOUNTER — Encounter: Payer: Self-pay | Admitting: Family Medicine

## 2016-02-14 DIAGNOSIS — J111 Influenza due to unidentified influenza virus with other respiratory manifestations: Secondary | ICD-10-CM | POA: Insufficient documentation

## 2016-02-14 LAB — POCT INFLUENZA A/B
Influenza A, POC: POSITIVE — AB
Influenza B, POC: POSITIVE — AB

## 2016-02-14 MED ORDER — OSELTAMIVIR PHOSPHATE 75 MG PO CAPS: 75.0000 mg | ORAL_CAPSULE | Freq: Two times a day (BID) | ORAL | 0 refills | Status: DC

## 2016-02-14 NOTE — Addendum Note (Signed)
Addended by: Jennelle HumanNEWTON, ASHLEIGH E on: 02/14/2016 01:48 PM   Modules accepted: Orders

## 2016-02-14 NOTE — Progress Notes (Signed)
Pre visit review using our clinic review tool, if applicable. No additional management support is needed unless otherwise documented below in the visit note. 

## 2016-02-14 NOTE — Assessment & Plan Note (Signed)
New problem. Rapid flu positive today. Treating with Tamiflu. Patient to call if she worsens or fails to improve given age and comorbidities.

## 2016-02-14 NOTE — Progress Notes (Signed)
   Subjective:  Patient ID: Haley SporeLena Curtis, female    DOB: 11-20-1945  Age: 71 y.o. MRN: [id number]  CC: Concern for Flu  HPI:  71 year old female with DM 2, hypertension, hyperlipidemia, CAD presents with concerns for influenza.  Possible influenza  Patient reports that she's been sick for the past 2 days. She has recently traveled to [place].  She has been experiencing fever, chills, cough, body aches. Symptoms are severe.  No other associated symptoms.  No known exacerbating or relieving factors.  She states that she got a flu vaccine earlier this year. She is concerned that she has the flu.  Social Hx   Social History   Social History  . Marital status: Single    Spouse name: N/A  . Number of children: N/A  . Years of education: N/A   Social History Main Topics  . Smoking status: Former Smoker    Quit date: 02/04/2000  . Smokeless tobacco: Never Used  . Alcohol use Yes  . Drug use: No  . Sexual activity: No   Other Topics Concern  . None   Social History Narrative  . None   Review of Systems  Constitutional: Positive for fever.  Respiratory: Positive for cough.   Musculoskeletal:       Body aches.   Objective:  BP 139/87   Pulse (!) 111   Temp (!) 102.2 F (39 C) (Oral)   Resp 14   Wt 220 lb 6.4 oz (100 kg)   SpO2 97%   BMI 37.25 kg/m   BP/Weight 02/14/2016 10/25/2015 08/20/2015  Systolic BP 139 132 140  Diastolic BP 87 96 88  Wt. (Lbs) 220.4 218 228.2  BMI 37.25 36.84 37.97    Physical Exam  Constitutional: She is oriented to person, place, and time. She appears well-developed. No distress.  Cardiovascular: Regular rhythm.   Tachycardic.  Pulmonary/Chest: Effort normal and breath sounds normal.  Neurological: She is alert and oriented to person, place, and time.  Psychiatric: She has a normal mood and affect.  Vitals reviewed.  Lab Results  Component Value Date   WBC 7.2 10/25/2015   HGB 12.6 10/25/2015   HCT 38.1 10/25/2015   PLT  282.0 10/25/2015   GLUCOSE 119 (H) 10/25/2015   CHOL 158 10/25/2015   TRIG 147.0 10/25/2015   HDL 37.60 (L) 10/25/2015   LDLDIRECT 183.5 10/15/2012   LDLCALC 91 10/25/2015   ALT 16 10/25/2015   AST 16 10/25/2015   NA 141 10/25/2015   K 4.1 10/25/2015   CL 102 10/25/2015   CREATININE 1.00 10/25/2015   BUN 23 10/25/2015   CO2 30 10/25/2015   HGBA1C 7.2 (H) 10/25/2015   MICROALBUR 5.2 (H) 10/25/2015    Assessment & Plan:   Problem List Items Addressed This Visit    Influenza    New problem. Rapid flu positive today. Treating with Tamiflu. Patient to call if she worsens or fails to improve given age and comorbidities.      Relevant Medications   oseltamivir (TAMIFLU) 75 MG capsule     Meds ordered this encounter  Medications  . oseltamivir (TAMIFLU) 75 MG capsule    Sig: Take 1 capsule (75 mg total) by mouth 2 (two) times daily.    Dispense:  10 capsule    Refill:  0   Follow-up: PRN  Everlene OtherJayce Kenneshia Rehm DO [facility] [place]

## 2016-02-14 NOTE — Patient Instructions (Signed)

## 2016-02-18 ENCOUNTER — Other Ambulatory Visit: Payer: Self-pay | Admitting: Family Medicine

## 2016-03-10 ENCOUNTER — Telehealth: Payer: Self-pay | Admitting: Family Medicine

## 2016-03-10 NOTE — Telephone Encounter (Signed)
Pt has not been seen or had labs since September. Pt did not receive a call from me.

## 2016-03-10 NOTE — Telephone Encounter (Signed)
Pt daughter [name] called stating she received a phone call regarding pt needing a appt for labs. No current order in chart. Please advise?  Call pt @ [phone]. Thank you!

## 2016-03-10 NOTE — Telephone Encounter (Signed)
Ok. Np. I called the daughter back and informed her that we did not call. Daughter was ok with that.

## 2016-03-16 ENCOUNTER — Other Ambulatory Visit: Payer: Self-pay | Admitting: Family Medicine

## 2016-03-17 NOTE — Telephone Encounter (Signed)
Refilled 02/18/2016. Pt last seen 10/25/15. Pt stated that she had nausea, diarrhea, and blurred vision. Please advise?

## 2016-04-02 ENCOUNTER — Telehealth: Payer: Self-pay | Admitting: Family Medicine

## 2016-04-02 NOTE — Telephone Encounter (Signed)
Pt will call back to sch AWV. Thank you! °

## 2016-04-12 ENCOUNTER — Other Ambulatory Visit: Payer: Self-pay | Admitting: Family Medicine

## 2016-04-14 ENCOUNTER — Telehealth: Payer: Self-pay | Admitting: Family Medicine

## 2016-04-14 NOTE — Telephone Encounter (Signed)
[place] from Pharmacy called and wanted to check to make sure pt is taking correct dosage of metoprolol succinate (TOPROL-XL) 25 MG 24 hr tablet. Pharmacist said that the max dosage is 2500 mg a day she is current taking 3000 mg. Please advise, thank you!

## 2016-04-15 NOTE — Telephone Encounter (Signed)
I believe they are referring to Metformin. Please inform patient to take 2000 mg/day.

## 2016-04-15 NOTE — Telephone Encounter (Signed)
Pt was called to confirm she was taking 2,000 mg of the metformin she stated she was. Pharmacy was called and told to chang rx to take on tablet in the morning and one tablet at night.

## 2016-04-15 NOTE — Addendum Note (Signed)
Addended by: Jennelle HumanNEWTON, Nichola Warren E on: 04/15/2016 10:06 AM   Modules accepted: Orders

## 2016-04-15 NOTE — Addendum Note (Signed)
Addended by: Jennelle HumanNEWTON, Meena Barrantes E on: 04/15/2016 10:11 AM   Modules accepted: Orders

## 2016-05-16 ENCOUNTER — Telehealth: Payer: Self-pay | Admitting: Family Medicine

## 2016-05-16 NOTE — Telephone Encounter (Signed)
Patient was called to schedule appointment for nurse visit to close Quality metric gaps in care , patient. Patient was given the fall risk screening and the depression screening PHQ-2 score 0 , Patient refused pneumonia vaccine, diabetic eye exam, or colon cancer screening. Refused appointment for documentation of obesity management. FYI

## 2016-07-01 ENCOUNTER — Other Ambulatory Visit: Payer: Self-pay | Admitting: Cardiology

## 2016-07-03 ENCOUNTER — Telehealth: Payer: Self-pay | Admitting: Family Medicine

## 2016-07-03 NOTE — Telephone Encounter (Signed)
Left pt message asking to call Allison back directly at [phone] to schedule AWV. Thanks! °

## 2016-08-07 NOTE — Telephone Encounter (Signed)
Left pt message asking to call Allison back directly at [phone] to schedule AWV. Thanks! °

## 2016-09-10 ENCOUNTER — Ambulatory Visit (INDEPENDENT_AMBULATORY_CARE_PROVIDER_SITE_OTHER): Payer: Medicare Other | Admitting: Family Medicine

## 2016-09-10 ENCOUNTER — Encounter: Payer: Self-pay | Admitting: Family Medicine

## 2016-09-10 VITALS — BP 134/100 | HR 86 | Temp 98.6°F | Wt 220.2 lb

## 2016-09-10 DIAGNOSIS — E785 Hyperlipidemia, unspecified: Secondary | ICD-10-CM | POA: Diagnosis not present

## 2016-09-10 DIAGNOSIS — I2510 Atherosclerotic heart disease of native coronary artery without angina pectoris: Secondary | ICD-10-CM

## 2016-09-10 DIAGNOSIS — E118 Type 2 diabetes mellitus with unspecified complications: Secondary | ICD-10-CM | POA: Diagnosis not present

## 2016-09-10 DIAGNOSIS — I10 Essential (primary) hypertension: Secondary | ICD-10-CM | POA: Diagnosis not present

## 2016-09-10 LAB — COMPREHENSIVE METABOLIC PANEL
ALBUMIN: 4.2 g/dL (ref 3.5–5.2)
ALT: 11 U/L (ref 0–35)
AST: 14 U/L (ref 0–37)
Alkaline Phosphatase: 89 U/L (ref 39–117)
BUN: 21 mg/dL (ref 6–23)
CALCIUM: 9.5 mg/dL (ref 8.4–10.5)
CHLORIDE: 102 meq/L (ref 96–112)
CO2: 32 mEq/L (ref 19–32)
CREATININE: 1.04 mg/dL (ref 0.40–1.20)
GFR: 67.26 mL/min (ref >60.00)
Glucose, Bld: 149 mg/dL — ABNORMAL HIGH (ref 70–99)
Potassium: 4.3 mEq/L (ref 3.5–5.1)
Sodium: 138 mEq/L (ref 135–145)
Total Bilirubin: 0.5 mg/dL (ref 0.2–1.2)
Total Protein: 7.7 g/dL (ref 6.0–8.3)

## 2016-09-10 LAB — LIPID PANEL
Cholesterol: 144 mg/dL (ref 0–200)
HDL: 33.6 mg/dL — ABNORMAL LOW (ref >39.00)
LDL CALC: 83 mg/dL (ref 0–99)
NonHDL: 110.04
Total CHOL/HDL Ratio: 4
Triglycerides: 135 mg/dL (ref 0.0–149.0)
VLDL: 27 mg/dL (ref 0.0–40.0)

## 2016-09-10 LAB — HEMOGLOBIN A1C: Hgb A1c MFr Bld: 7.5 % — ABNORMAL HIGH (ref 4.6–6.5)

## 2016-09-10 MED ORDER — SIMVASTATIN 80 MG PO TABS: 80.0000 mg | ORAL_TABLET | Freq: Every day | ORAL | 3 refills | Status: DC

## 2016-09-10 MED ORDER — METOPROLOL SUCCINATE ER 25 MG PO TB24: ORAL_TABLET | ORAL | 3 refills | Status: DC

## 2016-09-10 MED ORDER — METFORMIN HCL 1000 MG PO TABS: 1000.0000 mg | ORAL_TABLET | Freq: Two times a day (BID) | ORAL | 3 refills | Status: DC

## 2016-09-10 MED ORDER — HYDROCHLOROTHIAZIDE 25 MG PO TABS: 12.5000 mg | ORAL_TABLET | Freq: Every day | ORAL | 3 refills | Status: DC

## 2016-09-10 NOTE — Assessment & Plan Note (Signed)
BP elevated today. Secondary to noncompliance. Advise compliance with metoprolol and daily HCTZ.

## 2016-09-10 NOTE — Assessment & Plan Note (Signed)
Lipid panel today. Continue simvastatin. I have advocated for high intensity statin therapy and patient has declined previously.

## 2016-09-10 NOTE — Assessment & Plan Note (Signed)
Stable. Needs to have a cardiologist. Continue aspirin, metoprolol, statin.

## 2016-09-10 NOTE — Patient Instructions (Signed)
Take your medications as prescribed.  Follow up in 6 months.  Take care  Dr. Adriana Simasook

## 2016-09-10 NOTE — Progress Notes (Signed)
Subjective:  Patient ID: Haley Curtis, female    DOB: 10-Nov-1945  Age: 71 y.o. MRN: [id number]  CC: Follow up  HPI:  71 year old female with coronary artery disease status post CABG, DM 2, hypertension, hyperlipidemia presents for follow-up.  CAD  Stable currently asymptomatic.  On aspirin, metoprolol, statin.  DM 2  Needs A1c. Has not had one since last year.  Currently on metformin 1000 mg twice a day.  Tolerating without difficulty.  She states that her sugars are typically below 150.  HTN  BP mildly elevated today.  Patient states that she forgot to take her blood pressure medication yesterday and has not taken it today either. She is currently on Toprol 25 mg daily. She is supposed to be on HCTZ 12.5 mg daily. She has been taking this PRN.  HLD  Last lipid panel was not at goal.  Her simvastatin was increased. She did not want to start high-intensity statin therapy.  Needs lipid panel today.  Social Hx   Social History   Social History  . Marital status: Single    Spouse name: N/A  . Number of children: N/A  . Years of education: N/A   Social History Main Topics  . Smoking status: Former Smoker    Quit date: 02/04/2000  . Smokeless tobacco: Never Used  . Alcohol use Yes  . Drug use: No  . Sexual activity: No   Other Topics Concern  . None   Social History Narrative  . None    Review of Systems  Respiratory: Negative.   Cardiovascular: Negative.    Objective:  BP (!) 134/100 (BP Location: Left Arm, Patient Position: Sitting, Cuff Size: Large)   Pulse 86   Temp 98.6 F (37 C) (Oral)   Wt 220 lb 4 oz (99.9 kg)   SpO2 98%   BMI 37.22 kg/m   BP/Weight 09/10/2016 02/14/2016 10/25/2015  Systolic BP 134 139 132  Diastolic BP 100 87 96  Wt. (Lbs) 220.25 220.4 218  BMI 37.22 37.25 36.84    Physical Exam  Constitutional: She is oriented to person, place, and time. She appears well-developed. No distress.  HENT:  Head: Normocephalic and  atraumatic.  Eyes: Conjunctivae are normal. No scleral icterus.  Cardiovascular: Normal rate and regular rhythm.   No murmur heard. Pulmonary/Chest: Effort normal. She has no wheezes. She has no rales.  Neurological: She is alert and oriented to person, place, and time.  Psychiatric: She has a normal mood and affect.  Vitals reviewed.   Lab Results  Component Value Date   WBC 7.2 10/25/2015   HGB 12.6 10/25/2015   HCT 38.1 10/25/2015   PLT 282.0 10/25/2015   GLUCOSE 119 (H) 10/25/2015   CHOL 158 10/25/2015   TRIG 147.0 10/25/2015   HDL 37.60 (L) 10/25/2015   LDLDIRECT 183.5 10/15/2012   LDLCALC 91 10/25/2015   ALT 16 10/25/2015   AST 16 10/25/2015   NA 141 10/25/2015   K 4.1 10/25/2015   CL 102 10/25/2015   CREATININE 1.00 10/25/2015   BUN 23 10/25/2015   CO2 30 10/25/2015   HGBA1C 7.2 (H) 10/25/2015   MICROALBUR 5.2 (H) 10/25/2015    Assessment & Plan:   Problem List Items Addressed This Visit    CAD (coronary artery disease)    Stable. Needs to have a cardiologist. Continue aspirin, metoprolol, statin.      Relevant Medications   hydrochlorothiazide (HYDRODIURIL) 25 MG tablet   metoprolol succinate (TOPROL-XL) 25 MG 24 hr  tablet   simvastatin (ZOCOR) 80 MG tablet   DM (diabetes mellitus), type 2 with complications (HCC) - Primary    Unsure of control. Patient endorses that her sugars are well controlled. Needs A1c today. Continue metformin.      Relevant Medications   metFORMIN (GLUCOPHAGE) 1000 MG tablet   simvastatin (ZOCOR) 80 MG tablet   Other Relevant Orders   Comprehensive metabolic panel   Hemoglobin A1c   Essential hypertension    BP elevated today. Secondary to noncompliance. Advise compliance with metoprolol and daily HCTZ.      Relevant Medications   hydrochlorothiazide (HYDRODIURIL) 25 MG tablet   metoprolol succinate (TOPROL-XL) 25 MG 24 hr tablet   simvastatin (ZOCOR) 80 MG tablet   Hyperlipidemia    Lipid panel today. Continue  simvastatin. I have advocated for high intensity statin therapy and patient has declined previously.      Relevant Medications   hydrochlorothiazide (HYDRODIURIL) 25 MG tablet   metoprolol succinate (TOPROL-XL) 25 MG 24 hr tablet   simvastatin (ZOCOR) 80 MG tablet   Other Relevant Orders   Lipid panel      Meds ordered this encounter  Medications  . hydrochlorothiazide (HYDRODIURIL) 25 MG tablet    Sig: Take 0.5 tablets (12.5 mg total) by mouth daily.    Dispense:  45 tablet    Refill:  3  . metoprolol succinate (TOPROL-XL) 25 MG 24 hr tablet    Sig: TAKE 1 TABLET (25 MG TOTAL) BY MOUTH DAILY.    Dispense:  90 tablet    Refill:  3  . metFORMIN (GLUCOPHAGE) 1000 MG tablet    Sig: Take 1 tablet (1,000 mg total) by mouth 2 (two) times daily with a meal.    Dispense:  180 tablet    Refill:  3  . simvastatin (ZOCOR) 80 MG tablet    Sig: Take 1 tablet (80 mg total) by mouth daily.    Dispense:  90 tablet    Refill:  3   Follow-up: 6 months  Haley Curtis Haley Simas DO [facility]

## 2016-09-10 NOTE — Assessment & Plan Note (Signed)
Unsure of control. Patient endorses that her sugars are well controlled. Needs A1c today. Continue metformin.

## 2016-09-11 ENCOUNTER — Other Ambulatory Visit: Payer: Self-pay | Admitting: Family Medicine

## 2016-09-11 MED ORDER — ROSUVASTATIN CALCIUM 20 MG PO TABS: 20.0000 mg | ORAL_TABLET | Freq: Every day | ORAL | 3 refills | Status: DC

## 2016-09-11 MED ORDER — EMPAGLIFLOZIN 10 MG PO TABS: 10.0000 mg | ORAL_TABLET | Freq: Every day | ORAL | 1 refills | Status: DC

## 2016-09-15 ENCOUNTER — Telehealth: Payer: Self-pay | Admitting: Family Medicine

## 2016-09-15 NOTE — Telephone Encounter (Signed)
Pt called stating she does not want her Rx to be changed which was talk about at pt last visit. Pt wants to keep the old Rx. Please advise?  Call pt @ [phone]. Thank you!

## 2016-09-22 ENCOUNTER — Other Ambulatory Visit: Payer: Self-pay | Admitting: Cardiology

## 2016-09-22 ENCOUNTER — Other Ambulatory Visit: Payer: Self-pay | Admitting: Family Medicine

## 2016-09-26 ENCOUNTER — Other Ambulatory Visit: Payer: Self-pay | Admitting: Cardiology

## 2016-09-26 NOTE — Telephone Encounter (Signed)
error 

## 2016-09-26 NOTE — Telephone Encounter (Signed)
Order Providers   Prescribing Provider Encounter Provider  [place], Verdis Frederickson, DO Tommie Sams, DO  Medication Detail    Disp Refills Start End   hydrochlorothiazide (HYDRODIURIL) 25 MG tablet 45 tablet 3 09/10/2016    Sig - Route: Take 0.5 tablets (12.5 mg total) by mouth daily. - Oral   Sent to pharmacy as: hydrochlorothiazide (HYDRODIURIL) 25 MG tablet   E-Prescribing Status: Receipt confirmed by pharmacy (09/10/2016 9:11 AM EDT)   Pharmacy   [id number] - [place], [place] - [address]

## 2016-10-09 DIAGNOSIS — H59811 Chorioretinal scars after surgery for detachment, right eye: Secondary | ICD-10-CM | POA: Diagnosis not present

## 2016-10-09 DIAGNOSIS — Z961 Presence of intraocular lens: Secondary | ICD-10-CM | POA: Diagnosis not present

## 2016-10-09 DIAGNOSIS — H338 Other retinal detachments: Secondary | ICD-10-CM | POA: Diagnosis not present

## 2016-10-09 DIAGNOSIS — H31001 Unspecified chorioretinal scars, right eye: Secondary | ICD-10-CM | POA: Diagnosis not present

## 2016-10-09 DIAGNOSIS — H5213 Myopia, bilateral: Secondary | ICD-10-CM | POA: Diagnosis not present

## 2016-10-09 DIAGNOSIS — H35371 Puckering of macula, right eye: Secondary | ICD-10-CM | POA: Diagnosis not present

## 2016-10-09 DIAGNOSIS — H4389 Other disorders of vitreous body: Secondary | ICD-10-CM | POA: Diagnosis not present

## 2016-10-09 DIAGNOSIS — E119 Type 2 diabetes mellitus without complications: Secondary | ICD-10-CM | POA: Diagnosis not present

## 2016-10-09 DIAGNOSIS — H524 Presbyopia: Secondary | ICD-10-CM | POA: Diagnosis not present

## 2016-10-09 DIAGNOSIS — Z7984 Long term (current) use of oral hypoglycemic drugs: Secondary | ICD-10-CM | POA: Diagnosis not present

## 2016-10-09 DIAGNOSIS — H52222 Regular astigmatism, left eye: Secondary | ICD-10-CM | POA: Diagnosis not present

## 2016-10-09 DIAGNOSIS — H3341 Traction detachment of retina, right eye: Secondary | ICD-10-CM | POA: Diagnosis not present

## 2016-10-15 ENCOUNTER — Telehealth: Payer: Self-pay | Admitting: *Deleted

## 2016-10-15 NOTE — Telephone Encounter (Signed)
Pt requested a medication refill hydrochlorothiazide  [facility]

## 2016-10-16 NOTE — Telephone Encounter (Signed)
[facility] [address] - [place], [place] - [id number]

## 2016-10-16 NOTE — Telephone Encounter (Signed)
Left voice mail to call back, which [facility] does patient use

## 2016-10-17 MED ORDER — HYDROCHLOROTHIAZIDE 25 MG PO TABS: 25.0000 mg | ORAL_TABLET | Freq: Every day | ORAL | 3 refills | Status: DC

## 2016-10-17 NOTE — Telephone Encounter (Signed)
Medication has been refilled.

## 2016-10-19 ENCOUNTER — Other Ambulatory Visit: Payer: Self-pay | Admitting: Family Medicine

## 2016-10-19 ENCOUNTER — Other Ambulatory Visit: Payer: Self-pay | Admitting: Cardiology

## 2016-11-01 ENCOUNTER — Other Ambulatory Visit: Payer: Self-pay | Admitting: Cardiology

## 2016-11-16 ENCOUNTER — Other Ambulatory Visit: Payer: Self-pay | Admitting: Cardiology

## 2016-11-28 ENCOUNTER — Other Ambulatory Visit: Payer: Self-pay | Admitting: *Deleted

## 2016-11-28 ENCOUNTER — Telehealth: Payer: Self-pay | Admitting: Family Medicine

## 2016-11-28 ENCOUNTER — Ambulatory Visit (INDEPENDENT_AMBULATORY_CARE_PROVIDER_SITE_OTHER): Payer: Medicare Other

## 2016-11-28 DIAGNOSIS — Z23 Encounter for immunization: Secondary | ICD-10-CM | POA: Diagnosis not present

## 2016-11-28 MED ORDER — METOPROLOL SUCCINATE ER 25 MG PO TB24: ORAL_TABLET | ORAL | 0 refills | Status: DC

## 2016-11-28 NOTE — Addendum Note (Signed)
Addended by: Dennie BibleAVIS, Lamees Gable R on: 11/28/2016 11:34 AM   Modules accepted: Orders

## 2016-11-28 NOTE — Telephone Encounter (Signed)
Patient prescription for Metoprolol was sent to pharmacy , patient has scheduled new patient appointment with incoming PCP.

## 2016-11-28 NOTE — Telephone Encounter (Signed)
Pt daughter called back returning your call. Please advise, thank you!  Call [phone]

## 2016-12-23 ENCOUNTER — Ambulatory Visit (INDEPENDENT_AMBULATORY_CARE_PROVIDER_SITE_OTHER): Payer: Medicare Other | Admitting: Internal Medicine

## 2016-12-23 ENCOUNTER — Encounter: Payer: Self-pay | Admitting: Internal Medicine

## 2016-12-23 ENCOUNTER — Other Ambulatory Visit: Payer: Self-pay

## 2016-12-23 VITALS — BP 132/90 | HR 104 | Temp 99.2°F | Ht 64.0 in | Wt 221.8 lb

## 2016-12-23 DIAGNOSIS — Z1211 Encounter for screening for malignant neoplasm of colon: Secondary | ICD-10-CM

## 2016-12-23 DIAGNOSIS — E785 Hyperlipidemia, unspecified: Secondary | ICD-10-CM

## 2016-12-23 DIAGNOSIS — Z1329 Encounter for screening for other suspected endocrine disorder: Secondary | ICD-10-CM

## 2016-12-23 DIAGNOSIS — Z13818 Encounter for screening for other digestive system disorders: Secondary | ICD-10-CM

## 2016-12-23 DIAGNOSIS — Z1231 Encounter for screening mammogram for malignant neoplasm of breast: Secondary | ICD-10-CM

## 2016-12-23 DIAGNOSIS — Z23 Encounter for immunization: Secondary | ICD-10-CM | POA: Diagnosis not present

## 2016-12-23 DIAGNOSIS — E119 Type 2 diabetes mellitus without complications: Secondary | ICD-10-CM | POA: Diagnosis not present

## 2016-12-23 DIAGNOSIS — Z1159 Encounter for screening for other viral diseases: Secondary | ICD-10-CM

## 2016-12-23 DIAGNOSIS — I10 Essential (primary) hypertension: Secondary | ICD-10-CM

## 2016-12-23 DIAGNOSIS — I2510 Atherosclerotic heart disease of native coronary artery without angina pectoris: Secondary | ICD-10-CM | POA: Diagnosis not present

## 2016-12-23 DIAGNOSIS — E118 Type 2 diabetes mellitus with unspecified complications: Secondary | ICD-10-CM

## 2016-12-23 LAB — POCT URINALYSIS DIPSTICK
GLUCOSE UA: NEGATIVE
NITRITE UA: NEGATIVE
Protein, UA: NEGATIVE
RBC UA: NEGATIVE
Spec Grav, UA: 1.025 (ref 1.010–1.025)
Urobilinogen, UA: 0.2 E.U./dL
pH, UA: 5 (ref 5.0–8.0)

## 2016-12-23 MED ORDER — ATORVASTATIN CALCIUM 80 MG PO TABS: 80.0000 mg | ORAL_TABLET | Freq: Every day | ORAL | 1 refills | Status: DC

## 2016-12-23 NOTE — Progress Notes (Signed)
Chief Complaint  Patient presents with  . Establish Care  . Hypertension  . Diabetes   Establish care  1. She is doing well daughter and infant GD with her in visit today denies complaints  2. Need to confirm Crestor 20 vs Zocor 80 pt unsure which she is taking and reports she is on Zocor    Review of Systems  Respiratory: Negative for cough.   Cardiovascular: Negative for chest pain.  Gastrointestinal: Negative for abdominal pain and blood in stool.   Past Medical History:  Diagnosis Date  . Chicken pox   . Coronary artery disease   . Diabetes mellitus without complication (HCC)   . Hyperlipidemia   . Hypertension   . Urine incontinence    Past Surgical History:  Procedure Laterality Date  . ABDOMINAL HYSTERECTOMY    . CARDIAC CATHETERIZATION  05/22/2000   ef 60%  . CORONARY ARTERY BYPASS GRAFT     x3 vessel repair   . EYE SURGERY     det[facility] [place]. Still has trouble with right eye vision though able to see peripheral vision   . US ECHOCARDIOGRAPHY  03/10/2007   EF 55-60%   Family History  Problem Relation Age of Onset  . Hypertension Mother   . Stroke Mother   . Diabetes Mother   . Heart attack Father   . Hypertension Father   . Heart attack Brother   . Breast cancer Neg Hx   . Cancer Neg Hx        FH lung cancer    Social History   Socioeconomic History  . Marital status: Single    Spouse name: Not on file  . Number of children: Not on file  . Years of education: Not on file  . Highest education level: Not on file  Social Needs  . Financial resource strain: Not on file  . Food insecurity - worry: Not on file  . Food insecurity - inability: Not on file  . Transportation needs - medical: Not on file  . Transportation needs - non-medical: Not on file  Occupational History  . Not on file  Tobacco Use  . Smoking status: Former Smoker    Last attempt to quit: 02/04/2000    Years since quitting: 16.8  . Smokeless  tobacco: Never Used  . Tobacco comment: smoked 20 years quit 2002 max dose 1ppd   Substance and Sexual Activity  . Alcohol use: Yes  . Drug use: No  . Sexual activity: No  Other Topics Concern  . Not on file  Social History Narrative   Former smoker    Current Meds  Medication Sig  . aspirin 81 MG tablet Take 81 mg by mouth daily.  . empagliflozin (JARDIANCE) 10 MG TABS tablet Take 10 mg by mouth daily.  . hydrochlorothiazide (HYDRODIURIL) 25 MG tablet Take 1 tablet (25 mg total) by mouth daily.  . metFORMIN (GLUCOPHAGE) 1000 MG tablet TAKE ONE TABLET BY MOUTH TWICE A DAY  . metoprolol succinate (TOPROL-XL) 25 MG 24 hr tablet Take 1 tablet by mouth daily.  . Multiple Vitamin (MULTIVITAMIN) capsule Take 1 capsule by mouth daily.  . rosuvastatin (CRESTOR) 20 MG tablet Take 1 tablet (20 mg total) by mouth daily.  . simvastatin (ZOCOR) 80 MG tablet TAKE ONE TABLET BY MOUTH DAILY   No Known Allergies Vitals:   12/23/16 1553  Weight: 221 lb 12.8 oz (100.6 kg)  Height: 5\' 4"  (1.626 m)   Recent  Results (from the past 2160 hour(s))  POCT urinalysis dipstick     Status: Abnormal   Collection Time: 12/23/16  5:04 PM  Result Value Ref Range   Color, UA yellow    Clarity, UA clear    Glucose, UA neg    Bilirubin, UA small    Ketones, UA trace    Spec Grav, UA 1.025 1.010 - 1.025   Blood, UA neg    pH, UA 5.0 5.0 - 8.0   Protein, UA neg    Urobilinogen, UA 0.2 0.2 or 1.0 E.U./dL   Nitrite, UA neg    Leukocytes, UA Trace (A) Negative   Vitals:   12/23/16 1553  BP: 132/90  Pulse: (!) 104  Temp: 99.2 F (37.3 C)  SpO2: 95%    Objective  Physical Exam  Constitutional: She is oriented to person, place, and time and well-developed, well-nourished, and in no distress.  HENT:  Head: Normocephalic and atraumatic.  Mouth/Throat: Oropharynx is clear and moist and mucous membranes are normal.  Eyes: Conjunctivae are normal. Pupils are equal, round, and reactive to light.   Cardiovascular: Normal rate, regular rhythm and normal heart sounds.  Pulmonary/Chest: Effort normal and breath sounds normal.  Abdominal: Soft. Bowel sounds are normal.  +obese ab   Neurological: She is alert and oriented to person, place, and time.  Skin: Skin is warm, dry and intact.  Scar to right lower leg s/p CABG graft  Psychiatric: Mood, memory, affect and judgment normal.  Nursing note and vitals reviewed.  Assessment   1. DM 2  2. HTN/HLD (lipid 09/10/16 TC 144, HDL 33.60, LDL 83, TGs 135)/h/o CAD s/p CABG 3. HM  Plan  1. Check labs today BMET, CBC, TSH, UA, urine protein, A1C, hep B/C  Cont Jardiance  Cont statin, BB Check urine today  Pt just had eye exam at [facility] in [place] will get records  Will need to do foot exam in future   2. Cont meds consider adding Losartan low dose in future  Check to see if should be on Zocor or Crestor Zocor dose is too high  Crestor sent to wrong pharmacy and she never picked up  Will change to lipitor 80   3. Flu shot had 11/28/16  Given prevnar today pna 23 in 1 year  Pt needs Tdap in future and to disc shingrix  Referred for mammo due now No need for pap s/p hysterectomy  Referred to GI screening colonoscopy Dr. Servando SnareWohl never had  Consider DEXA in future  Consider low dose Ct screening for lung cancer in future  Check for hep B/C  Labs today    Provider: Dr. French Anaracy McLean-Scocuzza

## 2016-12-23 NOTE — Patient Instructions (Addendum)
1. Nice to meet you  2. We will do labs today and let you know results  3. We will refer you for mammogram and GI for colonoscopy  4. You should only be taking 1 cholesterol medication not Both will call later to let you know which one  5. Follow up in 3 months sooner if needed    Colonoscopy, Adult A colonoscopy is an exam to look at the entire large intestine. During the exam, a lubricated, bendable tube is inserted into the anus and then passed into the rectum, colon, and other parts of the large intestine. A colonoscopy is often done as a part of normal colorectal screening or in response to certain symptoms, such as anemia, persistent diarrhea, abdominal pain, and blood in the stool. The exam can help screen for and diagnose medical problems, including:  Tumors.  Polyps.  Inflammation.  Areas of bleeding.  Tell a health care provider about:  Any allergies you have.  All medicines you are taking, including vitamins, herbs, eye drops, creams, and over-the-counter medicines.  Any problems you or family members have had with anesthetic medicines.  Any blood disorders you have.  Any surgeries you have had.  Any medical conditions you have.  Any problems you have had passing stool. What are the risks? Generally, this is a safe procedure. However, problems may occur, including:  Bleeding.  A tear in the intestine.  A reaction to medicines given during the exam.  Infection (rare).  What happens before the procedure? Eating and drinking restrictions Follow instructions from your health care provider about eating and drinking, which may include:  A few days before the procedure - follow a low-fiber diet. Avoid nuts, seeds, dried fruit, raw fruits, and vegetables.  1-3 days before the procedure - follow a clear liquid diet. Drink only clear liquids, such as clear broth or bouillon, black coffee or tea, clear juice, clear soft drinks or sports drinks, gelatin dessert, and  popsicles. Avoid any liquids that contain red or purple dye.  On the day of the procedure - do not eat or drink anything during the 2 hours before the procedure, or within the time period that your health care provider recommends.  Bowel prep If you were prescribed an oral bowel prep to clean out your colon:  Take it as told by your health care provider. Starting the day before your procedure, you will need to drink a large amount of medicated liquid. The liquid will cause you to have multiple loose stools until your stool is almost clear or light green.  If your skin or anus gets irritated from diarrhea, you may use these to relieve the irritation: ? Medicated wipes, such as adult wet wipes with aloe and vitamin E. ? A skin soothing-product like petroleum jelly.  If you vomit while drinking the bowel prep, take a break for up to 60 minutes and then begin the bowel prep again. If vomiting continues and you cannot take the bowel prep without vomiting, call your health care provider.  General instructions  Ask your health care provider about changing or stopping your regular medicines. This is especially important if you are taking diabetes medicines or blood thinners.  Plan to have someone take you home from the hospital or clinic. What happens during the procedure?  An IV tube may be inserted into one of your veins.  You will be given medicine to help you relax (sedative).  To reduce your risk of infection: ? Your health  care team will wash or sanitize their hands. ? Your anal area will be washed with soap.  You will be asked to lie on your side with your knees bent.  Your health care provider will lubricate a long, thin, flexible tube. The tube will have a camera and a light on the end.  The tube will be inserted into your anus.  The tube will be gently eased through your rectum and colon.  Air will be delivered into your colon to keep it open. You may feel some pressure or  cramping.  The camera will be used to take images during the procedure.  A small tissue sample may be removed from your body to be examined under a microscope (biopsy). If any potential problems are found, the tissue will be sent to a lab for testing.  If small polyps are found, your health care provider may remove them and have them checked for cancer cells.  The tube that was inserted into your anus will be slowly removed. The procedure may vary among health care providers and hospitals. What happens after the procedure?  Your blood pressure, heart rate, breathing rate, and blood oxygen level will be monitored until the medicines you were given have worn off.  Do not drive for 24 hours after the exam.  You may have a small amount of blood in your stool.  You may pass gas and have mild abdominal cramping or bloating due to the air that was used to inflate your colon during the exam.  It is up to you to get the results of your procedure. Ask your health care provider, or the department performing the procedure, when your results will be ready. This information is not intended to replace advice given to you by your health care provider. Make sure you discuss any questions you have with your health care provider. Document Released: 01/18/2000 Document Revised: 11/21/2015 Document Reviewed: 04/03/2015 Elsevier Interactive Patient Education  2018 Elsevier Inc.    Pneumococcal Conjugate Vaccine suspension for injection What is this medicine? PNEUMOCOCCAL VACCINE (NEU mo KOK al vak SEEN) is a vaccine used to prevent pneumococcus bacterial infections. These bacteria can cause serious infections like pneumonia, meningitis, and blood infections. This vaccine will lower your chance of getting pneumonia. If you do get pneumonia, it can make your symptoms milder and your illness shorter. This vaccine will not treat an infection and will not cause infection. This vaccine is recommended for infants  and young children, adults with certain medical conditions, and adults 65 years or older. This medicine may be used for other purposes; ask your health care provider or pharmacist if you have questions. COMMON BRAND NAME(S): Prevnar, Prevnar 13 What should I tell my health care provider before I take this medicine? They need to know if you have any of these conditions: -bleeding problems -fever -immune system problems -an unusual or allergic reaction to pneumococcal vaccine, diphtheria toxoid, other vaccines, latex, other medicines, foods, dyes, or preservatives -pregnant or trying to get pregnant -breast-feeding How should I use this medicine? This vaccine is for injection into a muscle. It is given by a health care professional. A copy of Vaccine Information Statements will be given before each vaccination. Read this sheet carefully each time. The sheet may change frequently. Talk to your pediatrician regarding the use of this medicine in children. While this drug may be prescribed for children as young as 8 weeks old for selected conditions, precautions do apply. Overdosage: If you think you  have taken too much of this medicine contact a poison control center or emergency room at once. NOTE: This medicine is only for you. Do not share this medicine with others. What if I miss a dose? It is important not to miss your dose. Call your doctor or health care professional if you are unable to keep an appointment. What may interact with this medicine? -medicines for cancer chemotherapy -medicines that suppress your immune function -steroid medicines like prednisone or cortisone This list may not describe all possible interactions. Give your health care provider a list of all the medicines, herbs, non-prescription drugs, or dietary supplements you use. Also tell them if you smoke, drink alcohol, or use illegal drugs. Some items may interact with your medicine. What should I watch for while using  this medicine? Mild fever and pain should go away in 3 days or less. Report any unusual symptoms to your doctor or health care professional. What side effects may I notice from receiving this medicine? Side effects that you should report to your doctor or health care professional as soon as possible: -allergic reactions like skin rash, itching or hives, swelling of the face, lips, or tongue -breathing problems -confused -fast or irregular heartbeat -fever over 102 degrees F -seizures -unusual bleeding or bruising -unusual muscle weakness Side effects that usually do not require medical attention (report to your doctor or health care professional if they continue or are bothersome): -aches and pains -diarrhea -fever of 102 degrees F or less -headache -irritable -loss of appetite -pain, tender at site where injected -trouble sleeping This list may not describe all possible side effects. Call your doctor for medical advice about side effects. You may report side effects to FDA at 1-800-FDA-1088. Where should I keep my medicine? This does not apply. This vaccine is given in a clinic, pharmacy, doctor's office, or other health care setting and will not be stored at home. NOTE: This sheet is a summary. It may not cover all possible information. If you have questions about this medicine, talk to your doctor, pharmacist, or health care provider.  2018 Elsevier/Gold Standard (2013-10-27 10:27:27)

## 2016-12-24 ENCOUNTER — Telehealth: Payer: Self-pay

## 2016-12-24 LAB — HEPATITIS B SURFACE ANTIGEN: HEP B S AG: NONREACTIVE

## 2016-12-24 LAB — HEPATITIS C ANTIBODY
Hepatitis C Ab: NONREACTIVE
SIGNAL TO CUT-OFF: 0.06 (ref <1.00)

## 2016-12-24 LAB — HEMOGLOBIN A1C: Hgb A1c MFr Bld: 7.7 % — ABNORMAL HIGH (ref 4.6–6.5)

## 2016-12-24 LAB — CBC WITH DIFFERENTIAL/PLATELET
BASOS PCT: 0.8 % (ref 0.0–3.0)
Basophils Absolute: 0.1 10*3/uL (ref 0.0–0.1)
EOS PCT: 1 % (ref 0.0–5.0)
Eosinophils Absolute: 0.1 10*3/uL (ref 0.0–0.7)
HEMATOCRIT: 36.6 % (ref 36.0–46.0)
Hemoglobin: 11.8 g/dL — ABNORMAL LOW (ref 12.0–15.0)
LYMPHS PCT: 33.1 % (ref 12.0–46.0)
Lymphs Abs: 2.3 10*3/uL (ref 0.7–4.0)
MCHC: 32.2 g/dL (ref 30.0–36.0)
MCV: 90.3 fl (ref 78.0–100.0)
MONOS PCT: 8.7 % (ref 3.0–12.0)
Monocytes Absolute: 0.6 10*3/uL (ref 0.1–1.0)
NEUTROS ABS: 4 10*3/uL (ref 1.4–7.7)
Neutrophils Relative %: 56.4 % (ref 43.0–77.0)
PLATELETS: 305 10*3/uL (ref 150.0–400.0)
RBC: 4.06 Mil/uL (ref 3.87–5.11)
RDW: 14.9 % (ref 11.5–15.5)
WBC: 7.1 10*3/uL (ref 4.0–10.5)

## 2016-12-24 LAB — T4, FREE: Free T4: 0.79 ng/dL (ref 0.60–1.60)

## 2016-12-24 LAB — BASIC METABOLIC PANEL
BUN: 23 mg/dL (ref 6–23)
CHLORIDE: 103 meq/L (ref 96–112)
CO2: 29 meq/L (ref 19–32)
Calcium: 9.6 mg/dL (ref 8.4–10.5)
Creatinine, Ser: 0.96 mg/dL (ref 0.40–1.20)
GFR: 73.71 mL/min (ref >60.00)
GLUCOSE: 117 mg/dL — AB (ref 70–99)
POTASSIUM: 4.1 meq/L (ref 3.5–5.1)
SODIUM: 142 meq/L (ref 135–145)

## 2016-12-24 LAB — MICROALBUMIN / CREATININE URINE RATIO
Creatinine,U: 217 mg/dL
MICROALB UR: 1.6 mg/dL (ref 0.0–1.9)
Microalb Creat Ratio: 0.7 mg/g (ref 0.0–30.0)

## 2016-12-24 LAB — HEPATITIS B CORE ANTIBODY, TOTAL: Hep B Core Total Ab: NONREACTIVE

## 2016-12-24 LAB — TSH: TSH: 4.18 u[IU]/mL (ref 0.35–4.50)

## 2016-12-24 LAB — HEPATITIS B SURFACE ANTIBODY, QUANTITATIVE

## 2016-12-24 MED ORDER — SITAGLIPTIN PHOSPHATE 50 MG PO TABS: 50.0000 mg | ORAL_TABLET | Freq: Every day | ORAL | 1 refills | Status: DC

## 2016-12-24 NOTE — Addendum Note (Signed)
Addended by: Quentin OreMCLEAN-SCOCUZZA, Mavery Milling on: 12/24/2016 05:05 PM   Modules accepted: Orders

## 2016-12-24 NOTE — Telephone Encounter (Signed)
Copied from CRM [id number]. Topic: Quick Communication - Office Called Patient >> Dec 24, 2016 10:06 AM Clack, Princella PellegriniJessica D wrote: Reason for CRM: Pt daughter [name] returning the office phone call, didn't leave a very detail message.

## 2016-12-28 ENCOUNTER — Other Ambulatory Visit: Payer: Self-pay | Admitting: Family Medicine

## 2017-01-29 ENCOUNTER — Ambulatory Visit
Admission: RE | Admit: 2017-01-29 | Discharge: 2017-01-29 | Disposition: A | Discharge status: Home / Self Care | Payer: Medicare Other | Source: Ambulatory Visit | Attending: Internal Medicine | Admitting: Internal Medicine

## 2017-01-29 DIAGNOSIS — Z1231 Encounter for screening mammogram for malignant neoplasm of breast: Secondary | ICD-10-CM | POA: Insufficient documentation

## 2017-02-04 ENCOUNTER — Other Ambulatory Visit: Payer: Self-pay | Admitting: Internal Medicine

## 2017-02-04 DIAGNOSIS — I10 Essential (primary) hypertension: Secondary | ICD-10-CM

## 2017-02-04 MED ORDER — METOPROLOL SUCCINATE ER 25 MG PO TB24: ORAL_TABLET | ORAL | 1 refills | Status: DC

## 2017-02-09 ENCOUNTER — Telehealth: Payer: Self-pay

## 2017-02-09 NOTE — Telephone Encounter (Signed)
Gastroenterology Pre-Procedure Review  Request Date:  Requesting Physician: Dr.  PATIENT REVIEW QUESTIONS: The patient responded to the following health history questions as indicated:    1. Are you having any GI issues? No  2. Do you have a personal history of Polyps? No  3. Do you have a family history of Colon Cancer or Polyps? No  4. Diabetes Mellitus? No  5. Joint replacements in the past 12 months? No  6. Major health problems in the past 3 months? No  7. Any artificial heart valves, MVP, or defibrillator? No hardware, triple bypass Sx 2001    MEDICATIONS & ALLERGIES:    Patient reports the following regarding taking any anticoagulation/antiplatelet therapy:   Plavix, Coumadin, Eliquis, Xarelto, Lovenox, Pradaxa, Brilinta, or Effient? No  Aspirin? Yes, 81 mg   Patient confirms/reports the following medications:  Current Outpatient Medications  Medication Sig Dispense Refill  . aspirin 81 MG tablet Take 81 mg by mouth daily.    Marland Kitchen. atorvastatin (LIPITOR) 80 MG tablet Take 1 tablet (80 mg total) by mouth daily. 90 tablet 1  . empagliflozin (JARDIANCE) 10 MG TABS tablet Take 10 mg by mouth daily. 90 tablet 1  . hydrochlorothiazide (HYDRODIURIL) 25 MG tablet Take 1 tablet (25 mg total) by mouth daily. 45 tablet 3  . metFORMIN (GLUCOPHAGE) 1000 MG tablet TAKE ONE TABLET BY MOUTH TWICE A DAY 180 tablet 2  . metoprolol succinate (TOPROL-XL) 25 MG 24 hr tablet TAKE ONE TABLET BY MOUTH DAILY 90 tablet 1  . Multiple Vitamin (MULTIVITAMIN) capsule Take 1 capsule by mouth daily.    . sitaGLIPtin (JANUVIA) 50 MG tablet Take 1 tablet (50 mg total) by mouth daily. In am with food 90 tablet 1   No current facility-administered medications for this visit.     Patient confirms/reports the following allergies:  No Known Allergies  No orders of the defined types were placed in this encounter.   AUTHORIZATION INFORMATION Primary Insurance: 1D#: Group #:  Secondary Insurance: 1D#: Group  #:  SCHEDULE INFORMATION: Date: 02/26/17 Time: Location: [facility]

## 2017-02-11 ENCOUNTER — Other Ambulatory Visit: Payer: Self-pay

## 2017-02-11 DIAGNOSIS — Z1211 Encounter for screening for malignant neoplasm of colon: Secondary | ICD-10-CM

## 2017-02-26 ENCOUNTER — Ambulatory Visit: Admission: RE | Admit: 2017-02-26 | Payer: Medicare Other | Source: Ambulatory Visit | Admitting: Gastroenterology

## 2017-02-26 ENCOUNTER — Encounter: Admission: RE | Payer: Self-pay | Source: Ambulatory Visit

## 2017-02-26 SURGERY — COLONOSCOPY WITH PROPOFOL
Anesthesia: General

## 2017-03-25 ENCOUNTER — Ambulatory Visit: Payer: Medicare Other | Admitting: Internal Medicine

## 2017-04-11 ENCOUNTER — Other Ambulatory Visit: Payer: Self-pay | Admitting: Family Medicine

## 2017-06-10 ENCOUNTER — Telehealth: Payer: Self-pay

## 2017-06-10 ENCOUNTER — Telehealth: Payer: Self-pay | Admitting: Internal Medicine

## 2017-06-10 MED ORDER — METFORMIN HCL 1000 MG PO TABS: 1000.0000 mg | ORAL_TABLET | Freq: Two times a day (BID) | ORAL | 2 refills | Status: DC

## 2017-06-10 NOTE — Addendum Note (Signed)
Addended by: Elise Benne T on: 06/10/2017 01:19 PM   Modules accepted: Orders

## 2017-06-10 NOTE — Telephone Encounter (Signed)
Copied from CRM [id number]. Topic: Inquiry >> Jun 10, 2017 11:25 AM Yvonna Alanis wrote: Reason for CRM: Patient has requested a refill of MetFORMIN (GLUCOPHAGE) 1000 MG tablet. Patient will run out before her 07/03/2017 appt. Patient's preferred pharmacy is [facility] [address] - [place], [place] - [address] [phone] (Phone) [phone] (Fax).

## 2017-06-10 NOTE — Telephone Encounter (Signed)
Copied from CRM #[id number]. Topic: Inquiry °>> Jun 10, 2017 11:25 AM Robinson, Andra M wrote: °Reason for CRM: Patient has requested a refill of MetFORMIN (GLUCOPHAGE) 1000 MG tablet. Patient will run out before her 07/03/2017 appt. Patient's preferred pharmacy is [facility] - [place], [place] - [address] [phone] °(Phone) °[phone] (Fax). ° ° ° ° ° °

## 2017-06-24 ENCOUNTER — Other Ambulatory Visit: Payer: Self-pay | Admitting: Internal Medicine

## 2017-06-24 DIAGNOSIS — E785 Hyperlipidemia, unspecified: Secondary | ICD-10-CM

## 2017-06-24 MED ORDER — ATORVASTATIN CALCIUM 80 MG PO TABS: 80.0000 mg | ORAL_TABLET | Freq: Every day | ORAL | 1 refills | Status: DC

## 2017-07-03 ENCOUNTER — Other Ambulatory Visit: Payer: Self-pay

## 2017-07-03 ENCOUNTER — Ambulatory Visit (INDEPENDENT_AMBULATORY_CARE_PROVIDER_SITE_OTHER): Payer: Medicare Other | Admitting: Internal Medicine

## 2017-07-03 ENCOUNTER — Encounter: Payer: Self-pay | Admitting: Internal Medicine

## 2017-07-03 VITALS — BP 144/102 | HR 89 | Temp 98.6°F | Ht 64.0 in | Wt 213.2 lb

## 2017-07-03 DIAGNOSIS — E559 Vitamin D deficiency, unspecified: Secondary | ICD-10-CM | POA: Diagnosis not present

## 2017-07-03 DIAGNOSIS — E119 Type 2 diabetes mellitus without complications: Secondary | ICD-10-CM

## 2017-07-03 DIAGNOSIS — Z0184 Encounter for antibody response examination: Secondary | ICD-10-CM

## 2017-07-03 DIAGNOSIS — E118 Type 2 diabetes mellitus with unspecified complications: Secondary | ICD-10-CM

## 2017-07-03 DIAGNOSIS — Z1211 Encounter for screening for malignant neoplasm of colon: Secondary | ICD-10-CM

## 2017-07-03 DIAGNOSIS — Z1159 Encounter for screening for other viral diseases: Secondary | ICD-10-CM

## 2017-07-03 DIAGNOSIS — I10 Essential (primary) hypertension: Secondary | ICD-10-CM | POA: Diagnosis not present

## 2017-07-03 DIAGNOSIS — I2510 Atherosclerotic heart disease of native coronary artery without angina pectoris: Secondary | ICD-10-CM

## 2017-07-03 DIAGNOSIS — E785 Hyperlipidemia, unspecified: Secondary | ICD-10-CM

## 2017-07-03 LAB — LIPID PANEL
CHOL/HDL RATIO: 4
Cholesterol: 129 mg/dL (ref 0–200)
HDL: 29.6 mg/dL — ABNORMAL LOW (ref >39.00)
LDL Cholesterol: 78 mg/dL (ref 0–99)
NONHDL: 99.65
Triglycerides: 109 mg/dL (ref 0.0–149.0)
VLDL: 21.8 mg/dL (ref 0.0–40.0)

## 2017-07-03 LAB — COMPREHENSIVE METABOLIC PANEL
ALT: 66 U/L — AB (ref 0–35)
AST: 31 U/L (ref 0–37)
Albumin: 4.2 g/dL (ref 3.5–5.2)
Alkaline Phosphatase: 85 U/L (ref 39–117)
BILIRUBIN TOTAL: 0.5 mg/dL (ref 0.2–1.2)
BUN: 26 mg/dL — ABNORMAL HIGH (ref 6–23)
CALCIUM: 10 mg/dL (ref 8.4–10.5)
CHLORIDE: 100 meq/L (ref 96–112)
CO2: 30 meq/L (ref 19–32)
Creatinine, Ser: 1.18 mg/dL (ref 0.40–1.20)
GFR: 58.01 mL/min — ABNORMAL LOW (ref >60.00)
GLUCOSE: 133 mg/dL — AB (ref 70–99)
Potassium: 4.2 mEq/L (ref 3.5–5.1)
Sodium: 140 mEq/L (ref 135–145)
Total Protein: 7.7 g/dL (ref 6.0–8.3)

## 2017-07-03 LAB — HEMOGLOBIN A1C: Hgb A1c MFr Bld: 7.5 % — ABNORMAL HIGH (ref 4.6–6.5)

## 2017-07-03 LAB — CBC WITH DIFFERENTIAL/PLATELET
BASOS ABS: 0 10*3/uL (ref 0.0–0.1)
BASOS PCT: 0.7 % (ref 0.0–3.0)
Eosinophils Absolute: 0.1 10*3/uL (ref 0.0–0.7)
Eosinophils Relative: 1.3 % (ref 0.0–5.0)
HCT: 35.6 % — ABNORMAL LOW (ref 36.0–46.0)
Hemoglobin: 11.8 g/dL — ABNORMAL LOW (ref 12.0–15.0)
LYMPHS ABS: 2.2 10*3/uL (ref 0.7–4.0)
LYMPHS PCT: 36.4 % (ref 12.0–46.0)
MCHC: 33.2 g/dL (ref 30.0–36.0)
MCV: 89.4 fl (ref 78.0–100.0)
MONOS PCT: 7.9 % (ref 3.0–12.0)
Monocytes Absolute: 0.5 10*3/uL (ref 0.1–1.0)
NEUTROS ABS: 3.3 10*3/uL (ref 1.4–7.7)
Neutrophils Relative %: 53.7 % (ref 43.0–77.0)
PLATELETS: 315 10*3/uL (ref 150.0–400.0)
RBC: 3.99 Mil/uL (ref 3.87–5.11)
RDW: 14.9 % (ref 11.5–15.5)
WBC: 6.1 10*3/uL (ref 4.0–10.5)

## 2017-07-03 LAB — VITAMIN D 25 HYDROXY (VIT D DEFICIENCY, FRACTURES): VITD: 15.95 ng/mL — AB (ref 30.00–100.00)

## 2017-07-03 MED ORDER — TELMISARTAN-HCTZ 40-12.5 MG PO TABS: 1.0000 | ORAL_TABLET | Freq: Every day | ORAL | 0 refills | Status: DC

## 2017-07-03 MED ORDER — METOPROLOL SUCCINATE ER 25 MG PO TB24: ORAL_TABLET | ORAL | 3 refills | Status: DC

## 2017-07-03 MED ORDER — HYDROCHLOROTHIAZIDE 25 MG PO TABS: 25.0000 mg | ORAL_TABLET | Freq: Every day | ORAL | 2 refills | Status: DC

## 2017-07-03 NOTE — Progress Notes (Signed)
Pre visit review using our clinic review tool, if applicable. No additional management support is needed unless otherwise documented below in the visit note. 

## 2017-07-03 NOTE — Progress Notes (Signed)
Chief Complaint  Patient presents with  . Follow-up    medication refill   F/u with daughter and grandaughter no complaints   1. HTN elevated 152/94 repeat 144/102 on hctz 25 mg qd, toprol xl 25 mg needs refills she reports BP at home 130s/80s  2. Colonoscopy did not have due to cost of prep and GI told daughter she needs another referral  3. DM 2 last A1C 7.7 on Jardiance 10 mg, januvia 50, metformin 1000 mg bid unclear if taking medications will confirm again with patient she thinks her A1C will be elevated she reports had [facility] eye exam and cataract surgery in [place] fall 2019 need to get records.     Review of Systems  Constitutional: Positive for weight loss.       Down 8 lbs   HENT: Negative for hearing loss.   Eyes: Negative for blurred vision.  Respiratory: Negative for shortness of breath.   Cardiovascular: Negative for chest pain.  Gastrointestinal: Negative for abdominal pain.  Musculoskeletal: Negative for falls.  Skin: Negative for rash.  Neurological: Negative for headaches.  Psychiatric/Behavioral: Negative for depression.   Past Medical History:  Diagnosis Date  . Chicken pox   . Coronary artery disease   . Diabetes mellitus without complication ([place])   . Hyperlipidemia   . Hypertension   . Urine incontinence    Past Surgical History:  Procedure Laterality Date  . ABDOMINAL HYSTERECTOMY    . CARDIAC CATHETERIZATION  05/22/2000   ef 60%  . CORONARY ARTERY BYPASS GRAFT     x3 vessel repair   . EYE SURGERY     detached retina follows [facility] in [place]. Still has trouble with right eye vision though able to see peripheral vision   . US ECHOCARDIOGRAPHY  03/10/2007   EF 55-60%   Family History  Problem Relation Age of Onset  . Hypertension Mother   . Stroke Mother   . Diabetes Mother   . Heart attack Father   . Hypertension Father   . Heart attack Brother   . Breast cancer Neg Hx   . Cancer Neg Hx        FH lung cancer    Social History    Socioeconomic History  . Marital status: Single    Spouse name: Not on file  . Number of children: Not on file  . Years of education: Not on file  . Highest education level: Not on file  Occupational History  . Not on file  Social Needs  . Financial resource strain: Not on file  . Food insecurity:    Worry: Not on file    Inability: Not on file  . Transportation needs:    Medical: Not on file    Non-medical: Not on file  Tobacco Use  . Smoking status: Former Smoker    Last attempt to quit: 02/04/2000    Years since quitting: 17.4  . Smokeless tobacco: Never Used  . Tobacco comment: smoked 20 years quit 2002 max dose 1ppd   Substance and Sexual Activity  . Alcohol use: Yes  . Drug use: No  . Sexual activity: Never  Lifestyle  . Physical activity:    Days per week: Not on file    Minutes per session: Not on file  . Stress: Not on file  Relationships  . Social connections:    Talks on phone: Not on file    Gets together: Not on file    Attends religious service: Not  on file    Active member of club or organization: Not on file    Attends meetings of clubs or organizations: Not on file    Relationship status: Not on file  . Intimate partner violence:    Fear of current or ex partner: Not on file    Emotionally abused: Not on file    Physically abused: Not on file    Forced sexual activity: Not on file  Other Topics Concern  . Not on file  Social History Narrative   Former smoker    Current Meds  Medication Sig  . aspirin 81 MG tablet Take 81 mg by mouth daily.  Marland Kitchen atorvastatin (LIPITOR) 80 MG tablet Take 1 tablet (80 mg total) by mouth daily.  . hydrochlorothiazide (HYDRODIURIL) 25 MG tablet TAKE ONE TABLET BY MOUTH DAILY  . metFORMIN (GLUCOPHAGE) 1000 MG tablet Take 1 tablet (1,000 mg total) by mouth 2 (two) times daily.  . metoprolol succinate (TOPROL-XL) 25 MG 24 hr tablet TAKE ONE TABLET BY MOUTH DAILY  . Multiple Vitamin (MULTIVITAMIN) capsule Take 1 capsule  by mouth daily.   No Known Allergies No results found for this or any previous visit (from the past 2160 hour(s)). Objective  Body mass index is 36.6 kg/m. Wt Readings from Last 3 Encounters:  07/03/17 213 lb 3.2 oz (96.7 kg)  12/23/16 221 lb 12.8 oz (100.6 kg)  09/10/16 220 lb 4 oz (99.9 kg)   Temp Readings from Last 3 Encounters:  07/03/17 98.6 F (37 C) (Oral)  12/23/16 99.2 F (37.3 C) (Oral)  09/10/16 98.6 F (37 C) (Oral)   BP Readings from Last 3 Encounters:  07/03/17 (!) 152/94  12/23/16 132/90  09/10/16 (!) 134/100   Pulse Readings from Last 3 Encounters:  07/03/17 89  12/23/16 (!) 104  09/10/16 86    Physical Exam  Constitutional: She is oriented to person, place, and time. She appears well-developed and well-nourished. She is cooperative.  HENT:  Head: Normocephalic and atraumatic.  Mouth/Throat: Oropharynx is clear and moist and mucous membranes are normal.  Eyes: Pupils are equal, round, and reactive to light. Conjunctivae are normal.  Cardiovascular: Normal rate, regular rhythm and normal heart sounds.  Scar to mid chest   Pulmonary/Chest: Effort normal and breath sounds normal.  Neurological: She is alert and oriented to person, place, and time. Gait normal.  Skin: Skin is warm, dry and intact.  Psychiatric: She has a normal mood and affect. Her speech is normal and behavior is normal. Judgment and thought content normal. Cognition and memory are normal.  Nursing note and vitals reviewed.   Assessment   1. HTN uncontrolled/HLD 2. DM 2 last A1C 7.7 3. CAD s/p cabg  4. HM Plan  1.  D/c hctz 25 change to telmisartan 40-12.5, cont toprol XL 25 mg qd 2.  Will confirm meds again was supposed to be on jardiance 10, januvia 50 and metformin 1000 mg bid  Check feet at f/u  Urine due 12/2017  Pt to find out name of eye md fall 2019 and will get copy of records release signed today  3.  Cont meds  4.  Flu shot had 11/28/16  Prevnar 12/23/16 pna 23  due in 1 year Pt needs Tdap in future and to disc shingrix  Will rec hep b vaccine in future  Hep C neg  Check MMR status  01/29/17 mammo neg  No need for pap s/p hysterectomy  Referred to GI screening colonoscopy again canceled  1st due to cost of prep [facility]  Consider DEXA in future pt declined today  Consider low dose Ct screening for lung cancer in future  Labs today    Provider: Dr. Olivia Mackie McLean-Scocuzza-[facility]

## 2017-07-03 NOTE — Patient Instructions (Addendum)
My chart me in 1 month about your blood pressure  Find out name of eye doctor  Please reschedule colonoscopy  Take care   Diabetes Mellitus and Nutrition When you have diabetes (diabetes mellitus), it is very important to have healthy eating habits because your blood sugar (glucose) levels are greatly affected by what you eat and drink. Eating healthy foods in the appropriate amounts, at about the same times every day, can help you:  Control your blood glucose.  Lower your risk of heart disease.  Improve your blood pressure.  Reach or maintain a healthy weight.  Every person with diabetes is different, and each person has different needs for a meal plan. Your health care provider may recommend that you work with a diet and nutrition specialist (dietitian) to make a meal plan that is best for you. Your meal plan may vary depending on factors such as:  The calories you need.  The medicines you take.  Your weight.  Your blood glucose, blood pressure, and cholesterol levels.  Your activity level.  Other health conditions you have, such as heart or kidney disease.  How do carbohydrates affect me? Carbohydrates affect your blood glucose level more than any other type of food. Eating carbohydrates naturally increases the amount of glucose in your blood. Carbohydrate counting is a method for keeping track of how many carbohydrates you eat. Counting carbohydrates is important to keep your blood glucose at a healthy level, especially if you use insulin or take certain oral diabetes medicines. It is important to know how many carbohydrates you can safely have in each meal. This is different for every person. Your dietitian can help you calculate how many carbohydrates you should have at each meal and for snack. Foods that contain carbohydrates include:  Bread, cereal, rice, pasta, and crackers.  Potatoes and corn.  Peas, beans, and lentils.  Milk and yogurt.  Fruit and  juice.  Desserts, such as cakes, cookies, ice cream, and candy.  How does alcohol affect me? Alcohol can cause a sudden decrease in blood glucose (hypoglycemia), especially if you use insulin or take certain oral diabetes medicines. Hypoglycemia can be a life-threatening condition. Symptoms of hypoglycemia (sleepiness, dizziness, and confusion) are similar to symptoms of having too much alcohol. If your health care provider says that alcohol is safe for you, follow these guidelines:  Limit alcohol intake to no more than 1 drink per day for nonpregnant women and 2 drinks per day for men. One drink equals 12 oz of beer, 5 oz of wine, or 1 oz of hard liquor.  Do not drink on an empty stomach.  Keep yourself hydrated with water, diet soda, or unsweetened iced tea.  Keep in mind that regular soda, juice, and other mixers may contain a lot of sugar and must be counted as carbohydrates.  What are tips for following this plan? Reading food labels  Start by checking the serving size on the label. The amount of calories, carbohydrates, fats, and other nutrients listed on the label are based on one serving of the food. Many foods contain more than one serving per package.  Check the total grams (g) of carbohydrates in one serving. You can calculate the number of servings of carbohydrates in one serving by dividing the total carbohydrates by 15. For example, if a food has 30 g of total carbohydrates, it would be equal to 2 servings of carbohydrates.  Check the number of grams (g) of saturated and trans fats in one  serving. Choose foods that have low or no amount of these fats.  Check the number of milligrams (mg) of sodium in one serving. Most people should limit total sodium intake to less than 2,300 mg per day.  Always check the nutrition information of foods labeled as "low-fat" or "nonfat". These foods may be higher in added sugar or refined carbohydrates and should be avoided.  Talk to your  dietitian to identify your daily goals for nutrients listed on the label. Shopping  Avoid buying canned, premade, or processed foods. These foods tend to be high in fat, sodium, and added sugar.  Shop around the outside edge of the grocery store. This includes fresh fruits and vegetables, bulk grains, fresh meats, and fresh dairy. Cooking  Use low-heat cooking methods, such as baking, instead of high-heat cooking methods like deep frying.  Cook using healthy oils, such as olive, canola, or sunflower oil.  Avoid cooking with butter, cream, or high-fat meats. Meal planning  Eat meals and snacks regularly, preferably at the same times every day. Avoid going long periods of time without eating.  Eat foods high in fiber, such as fresh fruits, vegetables, beans, and whole grains. Talk to your dietitian about how many servings of carbohydrates you can eat at each meal.  Eat 4-6 ounces of lean protein each day, such as lean meat, chicken, fish, eggs, or tofu. 1 ounce is equal to 1 ounce of meat, chicken, or fish, 1 egg, or 1/4 cup of tofu.  Eat some foods each day that contain healthy fats, such as avocado, nuts, seeds, and fish. Lifestyle   Check your blood glucose regularly.  Exercise at least 30 minutes 5 or more days each week, or as told by your health care provider.  Take medicines as told by your health care provider.  Do not use any products that contain nicotine or tobacco, such as cigarettes and e-cigarettes. If you need help quitting, ask your health care provider.  Work with a Veterinary surgeon or diabetes educator to identify strategies to manage stress and any emotional and social challenges. What are some questions to ask my health care provider?  Do I need to meet with a diabetes educator?  Do I need to meet with a dietitian?  What number can I call if I have questions?  When are the best times to check my blood glucose? Where to find more information:  American Diabetes  Association: diabetes.org/food-and-fitness/food  Academy of Nutrition and Dietetics: https://www.vargas.com/  [employer] of Diabetes and Digestive and Kidney Diseases (NIH): FindJewelers.cz Summary  A healthy meal plan will help you control your blood glucose and maintain a healthy lifestyle.  Working with a diet and nutrition specialist (dietitian) can help you make a meal plan that is best for you.  Keep in mind that carbohydrates and alcohol have immediate effects on your blood glucose levels. It is important to count carbohydrates and to use alcohol carefully. This information is not intended to replace advice given to you by your health care provider. Make sure you discuss any questions you have with your health care provider. Document Released: 10/17/2004 Document Revised: 02/25/2016 Document Reviewed: 02/25/2016 Elsevier Interactive Patient Education  2018 [employer].  DASH Eating Plan DASH stands for "Dietary Approaches to Stop Hypertension." The DASH eating plan is a healthy eating plan that has been shown to reduce high blood pressure (hypertension). It may also reduce your risk for type 2 diabetes, heart disease, and stroke. The DASH eating plan may also  help with weight loss. What are tips for following this plan? General guidelines  Avoid eating more than 2,300 mg (milligrams) of salt (sodium) a day. If you have hypertension, you may need to reduce your sodium intake to 1,500 mg a day.  Limit alcohol intake to no more than 1 drink a day for nonpregnant women and 2 drinks a day for men. One drink equals 12 oz of beer, 5 oz of wine, or 1 oz of hard liquor.  Work with your health care provider to maintain a healthy body weight or to lose weight. Ask what an ideal weight is for you.  Get at least 30 minutes of exercise that causes your heart to beat faster  (aerobic exercise) most days of the week. Activities may include walking, swimming, or biking.  Work with your health care provider or diet and nutrition specialist (dietitian) to adjust your eating plan to your individual calorie needs. Reading food labels  Check food labels for the amount of sodium per serving. Choose foods with less than 5 percent of the Daily Value of sodium. Generally, foods with less than 300 mg of sodium per serving fit into this eating plan.  To find whole grains, look for the word "whole" as the first word in the ingredient list. Shopping  Buy products labeled as "low-sodium" or "no salt added."  Buy fresh foods. Avoid canned foods and premade or frozen meals. Cooking  Avoid adding salt when cooking. Use salt-free seasonings or herbs instead of table salt or sea salt. Check with your health care provider or pharmacist before using salt substitutes.  Do not fry foods. Cook foods using healthy methods such as baking, boiling, grilling, and broiling instead.  Cook with heart-healthy oils, such as olive, canola, soybean, or sunflower oil. Meal planning   Eat a balanced diet that includes: ? 5 or more servings of fruits and vegetables each day. At each meal, try to fill half of your plate with fruits and vegetables. ? Up to 6-8 servings of whole grains each day. ? Less than 6 oz of lean meat, poultry, or fish each day. A 3-oz serving of meat is about the same size as a deck of cards. One egg equals 1 oz. ? 2 servings of low-fat dairy each day. ? A serving of nuts, seeds, or beans 5 times each week. ? Heart-healthy fats. Healthy fats called Omega-3 fatty acids are found in foods such as flaxseeds and coldwater fish, like sardines, salmon, and mackerel.  Limit how much you eat of the following: ? Canned or prepackaged foods. ? Food that is high in trans fat, such as fried foods. ? Food that is high in saturated fat, such as fatty meat. ? Sweets, desserts, sugary  drinks, and other foods with added sugar. ? Full-fat dairy products.  Do not salt foods before eating.  Try to eat at least 2 vegetarian meals each week.  Eat more home-cooked food and less restaurant, buffet, and fast food.  When eating at a restaurant, ask that your food be prepared with less salt or no salt, if possible. What foods are recommended? The items listed may not be a complete list. Talk with your dietitian about what dietary choices are best for you. Grains Whole-grain or whole-wheat bread. Whole-grain or whole-wheat pasta. Brown rice. Orpah Cobb. Bulgur. Whole-grain and low-sodium cereals. Pita bread. Low-fat, low-sodium crackers. Whole-wheat flour tortillas. Vegetables Fresh or frozen vegetables (raw, steamed, roasted, or grilled). Low-sodium or reduced-sodium tomato and vegetable juice.  Low-sodium or reduced-sodium tomato sauce and tomato paste. Low-sodium or reduced-sodium canned vegetables. Fruits All fresh, dried, or frozen fruit. Canned fruit in natural juice (without added sugar). Meat and other protein foods Skinless chicken or [place]. Ground chicken or [place]. Pork with fat trimmed off. Fish and seafood. Egg whites. Dried beans, peas, or lentils. Unsalted nuts, nut butters, and seeds. Unsalted canned beans. Lean cuts of beef with fat trimmed off. Low-sodium, lean deli meat. Dairy Low-fat (1%) or fat-free (skim) milk. Fat-free, low-fat, or reduced-fat cheeses. Nonfat, low-sodium ricotta or cottage cheese. Low-fat or nonfat yogurt. Low-fat, low-sodium cheese. Fats and oils Soft margarine without trans fats. Vegetable oil. Low-fat, reduced-fat, or light mayonnaise and salad dressings (reduced-sodium). Canola, safflower, olive, soybean, and sunflower oils. Avocado. Seasoning and other foods Herbs. Spices. Seasoning mixes without salt. Unsalted popcorn and pretzels. Fat-free sweets. What foods are not recommended? The items listed may not be a complete list. Talk  with your dietitian about what dietary choices are best for you. Grains Baked goods made with fat, such as croissants, muffins, or some breads. Dry pasta or rice meal packs. Vegetables Creamed or fried vegetables. Vegetables in a cheese sauce. Regular canned vegetables (not low-sodium or reduced-sodium). Regular canned tomato sauce and paste (not low-sodium or reduced-sodium). Regular tomato and vegetable juice (not low-sodium or reduced-sodium). Rosita Fire. Olives. Fruits Canned fruit in a light or heavy syrup. Fried fruit. Fruit in cream or butter sauce. Meat and other protein foods Fatty cuts of meat. Ribs. Fried meat. Tomasa Blase. Sausage. Bologna and other processed lunch meats. Salami. Fatback. Hotdogs. Bratwurst. Salted nuts and seeds. Canned beans with added salt. Canned or smoked fish. Whole eggs or egg yolks. Chicken or [place] with skin. Dairy Whole or 2% milk, cream, and half-and-half. Whole or full-fat cream cheese. Whole-fat or sweetened yogurt. Full-fat cheese. Nondairy creamers. Whipped toppings. Processed cheese and cheese spreads. Fats and oils Butter. Stick margarine. Lard. Shortening. Ghee. Bacon fat. Tropical oils, such as coconut, palm kernel, or palm oil. Seasoning and other foods Salted popcorn and pretzels. Onion salt, garlic salt, seasoned salt, table salt, and sea salt. Worcestershire sauce. Tartar sauce. Barbecue sauce. Teriyaki sauce. Soy sauce, including reduced-sodium. Steak sauce. Canned and packaged gravies. Fish sauce. Oyster sauce. Cocktail sauce. Horseradish that you find on the shelf. Ketchup. Mustard. Meat flavorings and tenderizers. Bouillon cubes. Hot sauce and Tabasco sauce. Premade or packaged marinades. Premade or packaged taco seasonings. Relishes. Regular salad dressings. Where to find more information:  National Heart, Lung, and Blood Institute: PopSteam.is  American Heart Association: www.heart.org Summary  The DASH eating plan is a healthy eating plan  that has been shown to reduce high blood pressure (hypertension). It may also reduce your risk for type 2 diabetes, heart disease, and stroke.  With the DASH eating plan, you should limit salt (sodium) intake to 2,300 mg a day. If you have hypertension, you may need to reduce your sodium intake to 1,500 mg a day.  When on the DASH eating plan, aim to eat more fresh fruits and vegetables, whole grains, lean proteins, low-fat dairy, and heart-healthy fats.  Work with your health care provider or diet and nutrition specialist (dietitian) to adjust your eating plan to your individual calorie needs. This information is not intended to replace advice given to you by your health care provider. Make sure you discuss any questions you have with your health care provider. Document Released: 01/09/2011 Document Revised: 01/14/2016 Document Reviewed: 01/14/2016 Elsevier Interactive Patient Education  [employer].  Hypertension Hypertension, commonly called high blood pressure, is when the force of blood pumping through the arteries is too strong. The arteries are the blood vessels that carry blood from the heart throughout the body. Hypertension forces the heart to work harder to pump blood and may cause arteries to become narrow or stiff. Having untreated or uncontrolled hypertension can cause heart attacks, strokes, kidney disease, and other problems. A blood pressure reading consists of a higher number over a lower number. Ideally, your blood pressure should be below 120/80. The first ("top") number is called the systolic pressure. It is a measure of the pressure in your arteries as your heart beats. The second ("bottom") number is called the diastolic pressure. It is a measure of the pressure in your arteries as the heart relaxes. What are the causes? The cause of this condition is not known. What increases the risk? Some risk factors for high blood pressure are under your control. Others are  not. Factors you can change  Smoking.  Having type 2 diabetes mellitus, high cholesterol, or both.  Not getting enough exercise or physical activity.  Being overweight.  Having too much fat, sugar, calories, or salt (sodium) in your diet.  Drinking too much alcohol. Factors that are difficult or impossible to change  Having chronic kidney disease.  Having a family history of high blood pressure.  Age. Risk increases with age.  Race. You may be at higher risk if you are African-American.  Gender. Men are at higher risk than women before age 22. After age 99, women are at higher risk than men.  Having obstructive sleep apnea.  Stress. What are the signs or symptoms? Extremely high blood pressure (hypertensive crisis) may cause:  Headache.  Anxiety.  Shortness of breath.  Nosebleed.  Nausea and vomiting.  Severe chest pain.  Jerky movements you cannot control (seizures).  How is this diagnosed? This condition is diagnosed by measuring your blood pressure while you are seated, with your arm resting on a surface. The cuff of the blood pressure monitor will be placed directly against the skin of your upper arm at the level of your heart. It should be measured at least twice using the same arm. Certain conditions can cause a difference in blood pressure between your right and left arms. Certain factors can cause blood pressure readings to be lower or higher than normal (elevated) for a short period of time:  When your blood pressure is higher when you are in a health care provider's office than when you are at home, this is called white coat hypertension. Most people with this condition do not need medicines.  When your blood pressure is higher at home than when you are in a health care provider's office, this is called masked hypertension. Most people with this condition may need medicines to control blood pressure.  If you have a high blood pressure reading during one  visit or you have normal blood pressure with other risk factors:  You may be asked to return on a different day to have your blood pressure checked again.  You may be asked to monitor your blood pressure at home for 1 week or longer.  If you are diagnosed with hypertension, you may have other blood or imaging tests to help your health care provider understand your overall risk for other conditions. How is this treated? This condition is treated by making healthy lifestyle changes, such as eating healthy foods, exercising more, and reducing your alcohol intake.  Your health care provider may prescribe medicine if lifestyle changes are not enough to get your blood pressure under control, and if:  Your systolic blood pressure is above 130.  Your diastolic blood pressure is above 80.  Your personal target blood pressure may vary depending on your medical conditions, your age, and other factors. Follow these instructions at home: Eating and drinking  Eat a diet that is high in fiber and potassium, and low in sodium, added sugar, and fat. An example eating plan is called the DASH (Dietary Approaches to Stop Hypertension) diet. To eat this way: ? Eat plenty of fresh fruits and vegetables. Try to fill half of your plate at each meal with fruits and vegetables. ? Eat whole grains, such as whole wheat pasta, brown rice, or whole grain bread. Fill about one quarter of your plate with whole grains. ? Eat or drink low-fat dairy products, such as skim milk or low-fat yogurt. ? Avoid fatty cuts of meat, processed or cured meats, and poultry with skin. Fill about one quarter of your plate with lean proteins, such as fish, chicken without skin, beans, eggs, and tofu. ? Avoid premade and processed foods. These tend to be higher in sodium, added sugar, and fat.  Reduce your daily sodium intake. Most people with hypertension should eat less than 1,500 mg of sodium a day.  Limit alcohol intake to no more than 1  drink a day for nonpregnant women and 2 drinks a day for men. One drink equals 12 oz of beer, 5 oz of wine, or 1 oz of hard liquor. Lifestyle  Work with your health care provider to maintain a healthy body weight or to lose weight. Ask what an ideal weight is for you.  Get at least 30 minutes of exercise that causes your heart to beat faster (aerobic exercise) most days of the week. Activities may include walking, swimming, or biking.  Include exercise to strengthen your muscles (resistance exercise), such as pilates or lifting weights, as part of your weekly exercise routine. Try to do these types of exercises for 30 minutes at least 3 days a week.  Do not use any products that contain nicotine or tobacco, such as cigarettes and e-cigarettes. If you need help quitting, ask your health care provider.  Monitor your blood pressure at home as told by your health care provider.  Keep all follow-up visits as told by your health care provider. This is important. Medicines  Take over-the-counter and prescription medicines only as told by your health care provider. Follow directions carefully. Blood pressure medicines must be taken as prescribed.  Do not skip doses of blood pressure medicine. Doing this puts you at risk for problems and can make the medicine less effective.  Ask your health care provider about side effects or reactions to medicines that you should watch for. Contact a health care provider if:  You think you are having a reaction to a medicine you are taking.  You have headaches that keep coming back (recurring).  You feel dizzy.  You have swelling in your ankles.  You have trouble with your vision. Get help right away if:  You develop a severe headache or confusion.  You have unusual weakness or numbness.  You feel faint.  You have severe pain in your chest or abdomen.  You vomit repeatedly.  You have trouble breathing. Summary  Hypertension is when the force  of blood pumping through your arteries is too strong. If this condition is  not controlled, it may put you at risk for serious complications.  Your personal target blood pressure may vary depending on your medical conditions, your age, and other factors. For most people, a normal blood pressure is less than 120/80.  Hypertension is treated with lifestyle changes, medicines, or a combination of both. Lifestyle changes include weight loss, eating a healthy, low-sodium diet, exercising more, and limiting alcohol. This information is not intended to replace advice given to you by your health care provider. Make sure you discuss any questions you have with your health care provider. Document Released: 01/20/2005 Document Revised: 12/19/2015 Document Reviewed: 12/19/2015 Elsevier Interactive Patient Education  [employer].

## 2017-07-06 LAB — MEASLES/MUMPS/RUBELLA IMMUNITY
Mumps IgG: 165 AU/mL
RUBELLA: 15.6 {index}
Rubeola IgG: >300 AU/mL

## 2017-07-07 ENCOUNTER — Other Ambulatory Visit: Payer: Self-pay

## 2017-07-08 ENCOUNTER — Telehealth: Payer: Self-pay | Admitting: Gastroenterology

## 2017-07-08 ENCOUNTER — Other Ambulatory Visit: Payer: Self-pay

## 2017-07-08 DIAGNOSIS — Z1211 Encounter for screening for malignant neoplasm of colon: Secondary | ICD-10-CM

## 2017-07-08 MED ORDER — PEG 3350-KCL-NA BICARB-NACL 420 G PO SOLR
4000.0000 mL | Freq: Once | ORAL | 0 refills | Status: AC
Start: 2017-07-08 — End: 2017-07-08

## 2017-07-08 NOTE — Telephone Encounter (Signed)
Please call prep into [facility] in [place]. Insurance will not cover supprep.

## 2017-07-13 ENCOUNTER — Encounter: Payer: Self-pay | Admitting: Emergency Medicine

## 2017-07-14 ENCOUNTER — Encounter: Payer: Self-pay | Admitting: *Deleted

## 2017-07-14 ENCOUNTER — Ambulatory Visit
Admission: RE | Admit: 2017-07-14 | Discharge: 2017-07-14 | Disposition: A | Discharge status: Home / Self Care | Payer: Medicare Other | Source: Ambulatory Visit | Attending: Gastroenterology | Admitting: Gastroenterology

## 2017-07-14 ENCOUNTER — Encounter
Admission: RE | Disposition: A | Discharge status: Home / Self Care | Payer: Self-pay | Source: Ambulatory Visit | Attending: Gastroenterology

## 2017-07-14 ENCOUNTER — Ambulatory Visit: Payer: Medicare Other | Admitting: Registered Nurse

## 2017-07-14 DIAGNOSIS — Z79899 Other long term (current) drug therapy: Secondary | ICD-10-CM | POA: Insufficient documentation

## 2017-07-14 DIAGNOSIS — E1151 Type 2 diabetes mellitus with diabetic peripheral angiopathy without gangrene: Secondary | ICD-10-CM | POA: Diagnosis not present

## 2017-07-14 DIAGNOSIS — Z951 Presence of aortocoronary bypass graft: Secondary | ICD-10-CM | POA: Diagnosis not present

## 2017-07-14 DIAGNOSIS — Z1211 Encounter for screening for malignant neoplasm of colon: Secondary | ICD-10-CM | POA: Diagnosis not present

## 2017-07-14 DIAGNOSIS — I10 Essential (primary) hypertension: Secondary | ICD-10-CM | POA: Diagnosis not present

## 2017-07-14 DIAGNOSIS — I2510 Atherosclerotic heart disease of native coronary artery without angina pectoris: Secondary | ICD-10-CM | POA: Insufficient documentation

## 2017-07-14 DIAGNOSIS — Z87891 Personal history of nicotine dependence: Secondary | ICD-10-CM | POA: Diagnosis not present

## 2017-07-14 DIAGNOSIS — E785 Hyperlipidemia, unspecified: Secondary | ICD-10-CM | POA: Insufficient documentation

## 2017-07-14 DIAGNOSIS — Z8249 Family history of ischemic heart disease and other diseases of the circulatory system: Secondary | ICD-10-CM | POA: Insufficient documentation

## 2017-07-14 DIAGNOSIS — Z7982 Long term (current) use of aspirin: Secondary | ICD-10-CM | POA: Insufficient documentation

## 2017-07-14 DIAGNOSIS — Z7984 Long term (current) use of oral hypoglycemic drugs: Secondary | ICD-10-CM | POA: Insufficient documentation

## 2017-07-14 HISTORY — PX: COLONOSCOPY WITH PROPOFOL: SHX5780

## 2017-07-14 SURGERY — COLONOSCOPY WITH PROPOFOL
Anesthesia: General

## 2017-07-14 MED ORDER — SODIUM CHLORIDE 0.9 % IV SOLN
INTRAVENOUS | Status: DC
  Administered 2017-07-14: 1000 mL via INTRAVENOUS

## 2017-07-14 MED ORDER — PROPOFOL 500 MG/50ML IV EMUL
INTRAVENOUS | Status: AC
  Filled 2017-07-14: qty 100

## 2017-07-14 MED ORDER — SODIUM CHLORIDE 0.9 % IJ SOLN
INTRAMUSCULAR | Status: AC
  Filled 2017-07-14: qty 10

## 2017-07-14 MED ORDER — LIDOCAINE HCL (PF) 2 % IJ SOLN
INTRAMUSCULAR | Status: AC
  Filled 2017-07-14: qty 10

## 2017-07-14 MED ORDER — EPHEDRINE SULFATE 50 MG/ML IJ SOLN
INTRAMUSCULAR | Status: AC
  Filled 2017-07-14: qty 1

## 2017-07-14 MED ORDER — PROPOFOL 500 MG/50ML IV EMUL
INTRAVENOUS | Status: DC | PRN
  Administered 2017-07-14: 140 ug/kg/min via INTRAVENOUS

## 2017-07-14 MED ORDER — PROPOFOL 10 MG/ML IV BOLUS
INTRAVENOUS | Status: DC | PRN
  Administered 2017-07-14: 70 mg via INTRAVENOUS

## 2017-07-14 MED ORDER — PHENYLEPHRINE HCL 10 MG/ML IJ SOLN
INTRAMUSCULAR | Status: AC
  Filled 2017-07-14: qty 1

## 2017-07-14 NOTE — Transfer of Care (Signed)
Immediate Anesthesia Transfer of Care Note  Patient: Haley Curtis  Procedure(s) Performed: COLONOSCOPY WITH PROPOFOL (N/A )  Patient Location: PACU  Anesthesia Type:General  Level of Consciousness: awake, alert  and oriented  Airway & Oxygen Therapy: Patient Spontanous Breathing and Patient connected to nasal cannula oxygen  Post-op Assessment: Report given to RN and Post -op Vital signs reviewed and stable  Post vital signs: Reviewed and stable  Last Vitals:  Vitals Value Taken Time  BP 92/65 07/14/2017  8:41 AM  Temp 36.1 C 07/14/2017  8:41 AM  Pulse 81 07/14/2017  8:41 AM  Resp 13 07/14/2017  8:41 AM  SpO2 99 % 07/14/2017  8:41 AM    Last Pain:  Vitals:   07/14/17 0840  TempSrc: (P) Tympanic  PainSc:          Complications: No apparent anesthesia complications

## 2017-07-14 NOTE — Anesthesia Postprocedure Evaluation (Signed)
Anesthesia Post Note  Patient: Haley Curtis  Procedure(s) Performed: COLONOSCOPY WITH PROPOFOL (N/A )  Patient location during evaluation: PACU Anesthesia Type: General Level of consciousness: awake and alert Pain management: pain level controlled Vital Signs Assessment: post-procedure vital signs reviewed and stable Respiratory status: spontaneous breathing, nonlabored ventilation, respiratory function stable and patient connected to nasal cannula oxygen Cardiovascular status: blood pressure returned to baseline and stable Postop Assessment: no apparent nausea or vomiting Anesthetic complications: no     Last Vitals:  Vitals:   07/14/17 0840 07/14/17 0841  BP: 92/65 92/65  Pulse: 80 81  Resp: 14 13  Temp: (!) 36.1 C (!) 36.1 C  SpO2: 100% 99%    Last Pain:  Vitals:   07/14/17 0911  TempSrc:   PainSc: 0-No pain                 Yevette EdwardsJames G Adams

## 2017-07-14 NOTE — Op Note (Signed)
[facility] Gastroenterology Patient Name: Haley Curtis Procedure Date: 07/14/2017 7:27 AM MRN: [id number] Account #: [id number] Date of Birth: 1945-10-09 Admit Type: Outpatient Age: 72 Room: [facility] ENDO ROOM 1 Gender: Female Note Status: Finalized Procedure:            Colonoscopy Indications:          Screening for colorectal malignant neoplasm Providers:            Wyline Mood MD, MD Referring MD:         Pasty Spillers Mclean-Scocuzza MD, MD (Referring MD) Medicines:            Monitored Anesthesia Care Complications:        No immediate complications. Procedure:            Pre-Anesthesia Assessment:                       - Prior to the procedure, a History and Physical was                        performed, and patient medications, allergies and                        sensitivities were reviewed. The patient's tolerance of                        previous anesthesia was reviewed.                       - The risks and benefits of the procedure and the                        sedation options and risks were discussed with the                        patient. All questions were answered and informed                        consent was obtained.                       - ASA Grade Assessment: II - A patient with mild                        systemic disease.                       After obtaining informed consent, the colonoscope was                        passed under direct vision. Throughout the procedure,                        the patient's blood pressure, pulse, and oxygen                        saturations were monitored continuously. The                        Colonoscope was introduced through the anus and  advanced to the the cecum, identified by the                        appendiceal orifice, IC valve and transillumination.                        The colonoscopy was performed with ease. The patient                        tolerated the procedure  well. The quality of the bowel                        preparation was good. Findings:      The perianal and digital rectal examinations were normal.      The exam was otherwise without abnormality on direct and retroflexion       views. Impression:           - The examination was otherwise normal on direct and                        retroflexion views.                       - No specimens collected. Recommendation:       - Discharge patient to home (with escort).                       - Resume previous diet.                       - Continue present medications.                       - Repeat colonoscopy in 10 years for screening purposes. Procedure Code(s):    --- Professional ---                       [id number], Colonoscopy, flexible; diagnostic, including                        collection of specimen(s) by brushing or washing, when                        performed (separate procedure) Diagnosis Code(s):    --- Professional ---                       Z12.11, Encounter for screening for malignant neoplasm                        of colon CPT copyright 2017 American Medical Association. All rights reserved. The codes documented in this report are preliminary and upon coder review may  be revised to meet current compliance requirements. Wyline MoodKiran Shawntina Diffee, MD Wyline MoodKiran Mikaele Stecher MD, MD 07/14/2017 8:37:26 AM This report has been signed electronically. Number of Addenda: 0 Note Initiated On: 07/14/2017 7:27 AM Scope Withdrawal Time: 0 hours 16 minutes 42 seconds  Total Procedure Duration: 0 hours 22 minutes 13 seconds       [facility]

## 2017-07-14 NOTE — H&P (Signed)
Wyline MoodKiran Jakarius Flamenco, MD [address], [address], [place], [place], [id number] [address], [address], [place], [place], [address] Phone: [phone]  Fax: [phone]  Primary Care Physician:  McLean-Scocuzza, Pasty Spillersracy N, MD   Pre-Procedure History & Physical: HPI:  Gerri SporeLena Tousley is a 72 y.o. female is here for an colonoscopy.   Past Medical History:  Diagnosis Date  . Chicken pox   . Coronary artery disease   . Diabetes mellitus without complication (HCC)   . Hyperlipidemia   . Hypertension   . Urine incontinence     Past Surgical History:  Procedure Laterality Date  . ABDOMINAL HYSTERECTOMY    . CARDIAC CATHETERIZATION  05/22/2000   ef 60%  . CORONARY ARTERY BYPASS GRAFT     x3 vessel repair   . EYE SURGERY     [facility] [place]. Still has trouble with right eye vision though able to see peripheral vision   . US ECHOCARDIOGRAPHY  03/10/2007   EF 55-60%    Prior to Admission medications   Medication Sig Start Date End Date Taking? Authorizing Provider  aspirin 81 MG tablet Take 81 mg by mouth daily.   Yes [provider]  atorvastatin (LIPITOR) 80 MG tablet Take 1 tablet (80 mg total) by mouth daily. 06/24/17  Yes McLean-Scocuzza, Pasty Spillersracy N, MD  metFORMIN (GLUCOPHAGE) 1000 MG tablet Take 1 tablet (1,000 mg total) by mouth 2 (two) times daily. 06/10/17  Yes McLean-Scocuzza, Pasty Spillersracy N, MD  metoprolol succinate (TOPROL-XL) 25 MG 24 hr tablet TAKE ONE TABLET BY MOUTH DAILY 07/03/17  Yes McLean-Scocuzza, Pasty Spillersracy N, MD  Multiple Vitamin (MULTIVITAMIN) capsule Take 1 capsule by mouth daily.   Yes [provider]  telmisartan-hydrochlorothiazide (MICARDIS HCT) 40-12.5 MG tablet Take 1 tablet by mouth daily. 07/03/17  Yes McLean-Scocuzza, Pasty Spillersracy N, MD    Allergies as of 07/03/2017  . (No Known Allergies)    Family History  Problem Relation Age of Onset  . Hypertension Mother   . Stroke Mother   . Diabetes Mother   . Heart attack Father     . Hypertension Father   . Heart attack Brother   . Breast cancer Neg Hx   . Cancer Neg Hx        FH lung cancer     Social History   Socioeconomic History  . Marital status: Single    Spouse name: Not on file  . Number of children: Not on file  . Years of education: Not on file  . Highest education level: Not on file  Occupational History  . Not on file  Social Needs  . Financial resource strain: Not on file  . Food insecurity:    Worry: Not on file    Inability: Not on file  . Transportation needs:    Medical: Not on file    Non-medical: Not on file  Tobacco Use  . Smoking status: Former Smoker    Last attempt to quit: 02/04/2000    Years since quitting: 17.4  . Smokeless tobacco: Never Used  . Tobacco comment: smoked 20 years quit 2002 max dose 1ppd   Substance and Sexual Activity  . Alcohol use: Yes  . Drug use: No  . Sexual activity: Never  Lifestyle  . Physical activity:    Days per week: Not on file    Minutes per session: Not on file  . Stress: Not on file  Relationships  . Social connections:  Talks on phone: Not on file    Gets together: Not on file    Attends religious service: Not on file    Active member of club or organization: Not on file    Attends meetings of clubs or organizations: Not on file    Relationship status: Not on file  . Intimate partner violence:    Fear of current or ex partner: Not on file    Emotionally abused: Not on file    Physically abused: Not on file    Forced sexual activity: Not on file  Other Topics Concern  . Not on file  Social History Narrative   Former smoker     Review of Systems: See HPI, otherwise negative ROS  Physical Exam: BP (!) 149/91   Pulse (!) 107   Temp (!) 96.6 F (35.9 C) (Tympanic)   Resp 20   Ht 5\' 5"  (1.651 m)   Wt 213 lb (96.6 kg)   SpO2 100%   BMI 35.45 kg/m  General:   Alert,  pleasant and cooperative in NAD Head:  Normocephalic and atraumatic. Neck:  Supple; no masses or  thyromegaly. Lungs:  Clear throughout to auscultation, normal respiratory effort.    Heart:  +S1, +S2, Regular rate and rhythm, No edema. Abdomen:  Soft, nontender and nondistended. Normal bowel sounds, without guarding, and without rebound.   Neurologic:  Alert and  oriented x4;  grossly normal neurologically.  Impression/Plan: Nakeisha Greenhouse is here for an colonoscopy to be performed for Screening colonoscopy average risk   Risks, benefits, limitations, and alternatives regarding  colonoscopy have been reviewed with the patient.  Questions have been answered.  All parties agreeable.   Wyline Mood, MD  07/14/2017, 8:02 AM

## 2017-07-14 NOTE — Anesthesia Post-op Follow-up Note (Signed)
Anesthesia QCDR form completed.        

## 2017-07-14 NOTE — Anesthesia Preprocedure Evaluation (Signed)
Anesthesia Evaluation  Patient identified by MRN, date of birth, ID band Patient awake    Reviewed: Allergy & Precautions, H&P , NPO status , Patient's Chart, lab work & pertinent test results, reviewed documented beta blocker date and time   Airway Mallampati: II   Neck ROM: full    Dental  (+) Poor Dentition   Pulmonary neg pulmonary ROS, former smoker,    Pulmonary exam normal        Cardiovascular Exercise Tolerance: Poor hypertension, On Medications + CAD and + Peripheral Vascular Disease  negative cardio ROS Normal cardiovascular exam Rhythm:regular Rate:Normal     Neuro/Psych negative neurological ROS  negative psych ROS   GI/Hepatic negative GI ROS, Neg liver ROS,   Endo/Other  negative endocrine ROSdiabetes, Well Controlled, Type 2, Oral Hypoglycemic Agents  Renal/GU negative Renal ROS  negative genitourinary   Musculoskeletal   Abdominal   Peds  Hematology negative hematology ROS (+)   Anesthesia Other Findings Past Medical History: No date: Chicken pox No date: Coronary artery disease No date: Diabetes mellitus without complication (HCC) No date: Hyperlipidemia No date: Hypertension No date: Urine incontinence Past Surgical History: No date: ABDOMINAL HYSTERECTOMY 05/22/2000: CARDIAC CATHETERIZATION     Comment:  ef 60% No date: CORONARY ARTERY BYPASS GRAFT     Comment:  x3 vessel repair  No date: EYE SURGERY     Comment:  [facility]ached [facility] [place].               Still has trouble with right eye vision though able to               see peripheral vision  03/10/2007: US ECHOCARDIOGRAPHY     Comment:  EF 55-60% BMI    Body Mass Index:  35.45 kg/m     Reproductive/Obstetrics negative OB ROS                             Anesthesia Physical Anesthesia Plan  ASA: III  Anesthesia Plan: General   Post-op Pain Management:    Induction:   PONV  Risk Score and Plan:   Airway Management Planned:   Additional Equipment:   Intra-op Plan:   Post-operative Plan:   Informed Consent: I have reviewed the patients History and Physical, chart, labs and discussed the procedure including the risks, benefits and alternatives for the proposed anesthesia with the patient or authorized representative who has indicated his/her understanding and acceptance.   Dental Advisory Given  Plan Discussed with: CRNA  Anesthesia Plan Comments:         Anesthesia Quick Evaluation

## 2017-07-15 ENCOUNTER — Encounter: Payer: Self-pay | Admitting: Gastroenterology

## 2017-10-02 ENCOUNTER — Other Ambulatory Visit: Payer: Self-pay | Admitting: Internal Medicine

## 2017-10-02 DIAGNOSIS — I10 Essential (primary) hypertension: Secondary | ICD-10-CM

## 2017-10-02 MED ORDER — TELMISARTAN-HCTZ 40-12.5 MG PO TABS: 1.0000 | ORAL_TABLET | Freq: Every day | ORAL | 0 refills | Status: DC

## 2017-11-02 ENCOUNTER — Ambulatory Visit: Payer: Medicare Other | Admitting: Internal Medicine

## 2017-11-10 ENCOUNTER — Ambulatory Visit (INDEPENDENT_AMBULATORY_CARE_PROVIDER_SITE_OTHER): Payer: Medicare Other | Admitting: Internal Medicine

## 2017-11-10 ENCOUNTER — Encounter: Payer: Self-pay | Admitting: Internal Medicine

## 2017-11-10 VITALS — BP 128/78 | HR 86 | Temp 98.1°F | Ht 65.0 in | Wt 218.2 lb

## 2017-11-10 DIAGNOSIS — R29890 Loss of height: Secondary | ICD-10-CM

## 2017-11-10 DIAGNOSIS — E118 Type 2 diabetes mellitus with unspecified complications: Secondary | ICD-10-CM | POA: Diagnosis not present

## 2017-11-10 DIAGNOSIS — I10 Essential (primary) hypertension: Secondary | ICD-10-CM

## 2017-11-10 DIAGNOSIS — E2839 Other primary ovarian failure: Secondary | ICD-10-CM

## 2017-11-10 DIAGNOSIS — D649 Anemia, unspecified: Secondary | ICD-10-CM | POA: Diagnosis not present

## 2017-11-10 DIAGNOSIS — Z23 Encounter for immunization: Secondary | ICD-10-CM

## 2017-11-10 DIAGNOSIS — Z1231 Encounter for screening mammogram for malignant neoplasm of breast: Secondary | ICD-10-CM | POA: Diagnosis not present

## 2017-11-10 DIAGNOSIS — E119 Type 2 diabetes mellitus without complications: Secondary | ICD-10-CM

## 2017-11-10 DIAGNOSIS — E559 Vitamin D deficiency, unspecified: Secondary | ICD-10-CM | POA: Diagnosis not present

## 2017-11-10 LAB — CBC WITH DIFFERENTIAL/PLATELET
Basophils Absolute: 0 10*3/uL (ref 0.0–0.1)
Basophils Relative: 0.6 % (ref 0.0–3.0)
EOS ABS: 0.1 10*3/uL (ref 0.0–0.7)
EOS PCT: 2 % (ref 0.0–5.0)
HEMATOCRIT: 35.3 % — AB (ref 36.0–46.0)
HEMOGLOBIN: 11.7 g/dL — AB (ref 12.0–15.0)
LYMPHS PCT: 36.1 % (ref 12.0–46.0)
Lymphs Abs: 2.2 10*3/uL (ref 0.7–4.0)
MCHC: 33.1 g/dL (ref 30.0–36.0)
MCV: 89.3 fl (ref 78.0–100.0)
MONO ABS: 0.5 10*3/uL (ref 0.1–1.0)
Monocytes Relative: 7.8 % (ref 3.0–12.0)
Neutro Abs: 3.2 10*3/uL (ref 1.4–7.7)
Neutrophils Relative %: 53.5 % (ref 43.0–77.0)
Platelets: 294 10*3/uL (ref 150.0–400.0)
RBC: 3.95 Mil/uL (ref 3.87–5.11)
RDW: 15.7 % — AB (ref 11.5–15.5)
WBC: 6 10*3/uL (ref 4.0–10.5)

## 2017-11-10 LAB — COMPREHENSIVE METABOLIC PANEL
ALBUMIN: 4.2 g/dL (ref 3.5–5.2)
ALK PHOS: 94 U/L (ref 39–117)
ALT: 16 U/L (ref 0–35)
AST: 16 U/L (ref 0–37)
BUN: 26 mg/dL — ABNORMAL HIGH (ref 6–23)
CALCIUM: 9.7 mg/dL (ref 8.4–10.5)
CO2: 29 mEq/L (ref 19–32)
Chloride: 101 mEq/L (ref 96–112)
Creatinine, Ser: 1.15 mg/dL (ref 0.40–1.20)
GFR: 59.7 mL/min — AB (ref >60.00)
Glucose, Bld: 140 mg/dL — ABNORMAL HIGH (ref 70–99)
Potassium: 4.5 mEq/L (ref 3.5–5.1)
Sodium: 138 mEq/L (ref 135–145)
TOTAL PROTEIN: 7.7 g/dL (ref 6.0–8.3)
Total Bilirubin: 0.4 mg/dL (ref 0.2–1.2)

## 2017-11-10 LAB — HEMOGLOBIN A1C: HEMOGLOBIN A1C: 7.5 % — AB (ref 4.6–6.5)

## 2017-11-10 MED ORDER — CHOLECALCIFEROL 1.25 MG (50000 UT) PO CAPS: 50000.0000 [IU] | ORAL_CAPSULE | ORAL | 1 refills | Status: DC

## 2017-11-10 NOTE — Progress Notes (Signed)
Chief Complaint  Patient presents with  . Follow-up   F/u with daughter and granddaughter  1. HTN controlled on micardis 40-12.5  2. DM 2 last A1c 7.5 on metformin 100 mg bid  No complaints today   Review of Systems  Constitutional: Negative for weight loss.  HENT: Negative for hearing loss.   Eyes: Negative for blurred vision.  Respiratory: Negative for shortness of breath.   Gastrointestinal: Negative for abdominal pain.  Musculoskeletal: Negative for joint pain.  Skin: Negative for rash.  Neurological: Negative for headaches.  Psychiatric/Behavioral: Negative for depression.   Past Medical History:  Diagnosis Date  . Chicken pox   . Coronary artery disease   . Diabetes mellitus without complication (HCC)   . Hyperlipidemia   . Hypertension   . Urine incontinence    Past Surgical History:  Procedure Laterality Date  . ABDOMINAL HYSTERECTOMY    . CARDIAC CATHETERIZATION  05/22/2000   ef 60%  . COLONOSCOPY WITH PROPOFOL N/A 07/14/2017   Procedure: COLONOSCOPY WITH PROPOFOL;  Surgeon: Anna, Kiran, MD;  Location: [facility] ENDOSCOPY;  Service: Gastroenterology;  Laterality: N/A;  . CORONARY ARTERY BYPASS GRAFT     x3 vessel repair   . EYE SURGERY     detached retina follows [facility] in [place]. Still has trouble with right eye vision though able to see peripheral vision   . US ECHOCARDIOGRAPHY  03/10/2007   EF 55-60%   Family History  Problem Relation Age of Onset  . Hypertension Mother   . Stroke Mother   . Diabetes Mother   . Heart attack Father   . Hypertension Father   . Heart attack Brother   . Breast cancer Neg Hx   . Cancer Neg Hx        FH lung cancer    Social History   Socioeconomic History  . Marital status: Single    Spouse name: Not on file  . Number of children: Not on file  . Years of education: Not on file  . Highest education level: Not on file  Occupational History  . Not on file  Social Needs  . Financial resource strain: Not on file   . Food insecurity:    Worry: Not on file    Inability: Not on file  . Transportation needs:    Medical: Not on file    Non-medical: Not on file  Tobacco Use  . Smoking status: Former Smoker    Last attempt to quit: 02/04/2000    Years since quitting: 17.7  . Smokeless tobacco: Never Used  . Tobacco comment: smoked 20 years quit 2002 max dose 1ppd   Substance and Sexual Activity  . Alcohol use: Yes  . Drug use: No  . Sexual activity: Never  Lifestyle  . Physical activity:    Days per week: Not on file    Minutes per session: Not on file  . Stress: Not on file  Relationships  . Social connections:    Talks on phone: Not on file    Gets together: Not on file    Attends religious service: Not on file    Active member of club or organization: Not on file    Attends meetings of clubs or organizations: Not on file    Relationship status: Not on file  . Intimate partner violence:    Fear of current or ex partner: Not on file    Emotionally abused: Not on file    Physically abused: Not on file      Forced sexual activity: Not on file  Other Topics Concern  . Not on file  Social History Narrative   Former smoker    Current Meds  Medication Sig  . aspirin 81 MG tablet Take 81 mg by mouth daily.  . atorvastatin (LIPITOR) 80 MG tablet Take 1 tablet (80 mg total) by mouth daily.  . metFORMIN (GLUCOPHAGE) 1000 MG tablet Take 1 tablet (1,000 mg total) by mouth 2 (two) times daily.  . metoprolol succinate (TOPROL-XL) 25 MG 24 hr tablet TAKE ONE TABLET BY MOUTH DAILY  . Multiple Vitamin (MULTIVITAMIN) capsule Take 1 capsule by mouth daily.  . telmisartan-hydrochlorothiazide (MICARDIS HCT) 40-12.5 MG tablet Take 1 tablet by mouth daily.   No Known Allergies No results found for this or any previous visit (from the past 2160 hour(s)). Objective  Body mass index is 36.31 kg/m. Wt Readings from Last 3 Encounters:  11/10/17 218 lb 3.2 oz (99 kg)  07/14/17 213 lb (96.6 kg)  07/03/17 213  lb 3.2 oz (96.7 kg)   Temp Readings from Last 3 Encounters:  11/10/17 98.1 F (36.7 C) (Oral)  07/14/17 (!) 97 F (36.1 C)  07/03/17 98.6 F (37 C) (Oral)   BP Readings from Last 3 Encounters:  11/10/17 128/78  07/14/17 92/65  07/03/17 (!) 144/102   Pulse Readings from Last 3 Encounters:  11/10/17 86  07/14/17 81  07/03/17 89    Physical Exam  Constitutional: She is oriented to person, place, and time. Vital signs are normal. She appears well-developed and well-nourished. She is cooperative.  HENT:  Head: Normocephalic and atraumatic.  Mouth/Throat: Oropharynx is clear and moist and mucous membranes are normal.  Eyes: Pupils are equal, round, and reactive to light. Conjunctivae are normal.  Cardiovascular: Normal rate, regular rhythm and normal heart sounds.  Pulmonary/Chest: Effort normal and breath sounds normal.  Neurological: She is alert and oriented to person, place, and time. Gait normal.  Skin: Skin is warm, dry and intact.  Psychiatric: She has a normal mood and affect. Her speech is normal and behavior is normal. Judgment and thought content normal. Cognition and memory are normal.  Nursing note and vitals reviewed.   Assessment   1. HTN  2. DM 2 a1c 7.5  3. HM 4. Vit D def  Plan   1. Cont meds  2. Cont meds  Consider add januvia or jardiance pending A1C Check CMET, CBC, A1C today  Records Dr. Nathan Butterworth eye MD  Foot exam normal DP/PT pulses and normal monofilament today  3.  Flu shot given today  Prevnar 12/23/16 pna 23 due in 1 year Pt needs Tdap in future and to disc shingrix -given Rx today   Will rec hep b vaccine in future  Hep C neg  MMR immune  01/29/17 mammo neg referred today  No need for pap s/p hysterectomy  Colonoscopy had 07/2017 negative f/u in 10 years  Consider DEXA ordered today  Consider low dose Ct screening for lung cancer in future  -does not qualify CT chest  -smoker x 25 years quit in 2001 max 1ppd no FH lung cancer    4. D3 50K weekly x 6 months    Provider: Dr. Tracy McLean-Scocuzza-Internal Medicine  

## 2017-11-10 NOTE — Patient Instructions (Addendum)
Take D3 50K IU weekly x 6 months then D3 over the counter 5000 IU daily    Diabetes Mellitus and Nutrition When you have diabetes (diabetes mellitus), it is very important to have healthy eating habits because your blood sugar (glucose) levels are greatly affected by what you eat and drink. Eating healthy foods in the appropriate amounts, at about the same times every day, can help you:  Control your blood glucose.  Lower your risk of heart disease.  Improve your blood pressure.  Reach or maintain a healthy weight.  Every person with diabetes is different, and each person has different needs for a meal plan. Your health care provider may recommend that you work with a diet and nutrition specialist (dietitian) to make a meal plan that is best for you. Your meal plan may vary depending on factors such as:  The calories you need.  The medicines you take.  Your weight.  Your blood glucose, blood pressure, and cholesterol levels.  Your activity level.  Other health conditions you have, such as heart or kidney disease.  How do carbohydrates affect me? Carbohydrates affect your blood glucose level more than any other type of food. Eating carbohydrates naturally increases the amount of glucose in your blood. Carbohydrate counting is a method for keeping track of how many carbohydrates you eat. Counting carbohydrates is important to keep your blood glucose at a healthy level, especially if you use insulin or take certain oral diabetes medicines. It is important to know how many carbohydrates you can safely have in each meal. This is different for every person. Your dietitian can help you calculate how many carbohydrates you should have at each meal and for snack. Foods that contain carbohydrates include:  Bread, cereal, rice, pasta, and crackers.  Potatoes and corn.  Peas, beans, and lentils.  Milk and yogurt.  Fruit and juice.  Desserts, such as cakes, cookies, ice cream, and  candy.  How does alcohol affect me? Alcohol can cause a sudden decrease in blood glucose (hypoglycemia), especially if you use insulin or take certain oral diabetes medicines. Hypoglycemia can be a life-threatening condition. Symptoms of hypoglycemia (sleepiness, dizziness, and confusion) are similar to symptoms of having too much alcohol. If your health care provider says that alcohol is safe for you, follow these guidelines:  Limit alcohol intake to no more than 1 drink per day for nonpregnant women and 2 drinks per day for men. One drink equals 12 oz of beer, 5 oz of wine, or 1 oz of hard liquor.  Do not drink on an empty stomach.  Keep yourself hydrated with water, diet soda, or unsweetened iced tea.  Keep in mind that regular soda, juice, and other mixers may contain a lot of sugar and must be counted as carbohydrates.  What are tips for following this plan? Reading food labels  Start by checking the serving size on the label. The amount of calories, carbohydrates, fats, and other nutrients listed on the label are based on one serving of the food. Many foods contain more than one serving per package.  Check the total grams (g) of carbohydrates in one serving. You can calculate the number of servings of carbohydrates in one serving by dividing the total carbohydrates by 15. For example, if a food has 30 g of total carbohydrates, it would be equal to 2 servings of carbohydrates.  Check the number of grams (g) of saturated and trans fats in one serving. Choose foods that have low or  no amount of these fats.  Check the number of milligrams (mg) of sodium in one serving. Most people should limit total sodium intake to less than 2,300 mg per day.  Always check the nutrition information of foods labeled as "low-fat" or "nonfat". These foods may be higher in added sugar or refined carbohydrates and should be avoided.  Talk to your dietitian to identify your daily goals for nutrients listed  on the label. Shopping  Avoid buying canned, premade, or processed foods. These foods tend to be high in fat, sodium, and added sugar.  Shop around the outside edge of the grocery store. This includes fresh fruits and vegetables, bulk grains, fresh meats, and fresh dairy. Cooking  Use low-heat cooking methods, such as baking, instead of high-heat cooking methods like deep frying.  Cook using healthy oils, such as olive, canola, or sunflower oil.  Avoid cooking with butter, cream, or high-fat meats. Meal planning  Eat meals and snacks regularly, preferably at the same times every day. Avoid going long periods of time without eating.  Eat foods high in fiber, such as fresh fruits, vegetables, beans, and whole grains. Talk to your dietitian about how many servings of carbohydrates you can eat at each meal.  Eat 4-6 ounces of lean protein each day, such as lean meat, chicken, fish, eggs, or tofu. 1 ounce is equal to 1 ounce of meat, chicken, or fish, 1 egg, or 1/4 cup of tofu.  Eat some foods each day that contain healthy fats, such as avocado, nuts, seeds, and fish. Lifestyle   Check your blood glucose regularly.  Exercise at least 30 minutes 5 or more days each week, or as told by your health care provider.  Take medicines as told by your health care provider.  Do not use any products that contain nicotine or tobacco, such as cigarettes and e-cigarettes. If you need help quitting, ask your health care provider.  Work with a Veterinary surgeon or diabetes educator to identify strategies to manage stress and any emotional and social challenges. What are some questions to ask my health care provider?  Do I need to meet with a diabetes educator?  Do I need to meet with a dietitian?  What number can I call if I have questions?  When are the best times to check my blood glucose? Where to find more information:  American Diabetes Association: diabetes.org/food-and-fitness/food  Academy  of Nutrition and Dietetics: https://www.vargas.com/  [employer] of Diabetes and Digestive and Kidney Diseases (NIH): FindJewelers.cz Summary  A healthy meal plan will help you control your blood glucose and maintain a healthy lifestyle.  Working with a diet and nutrition specialist (dietitian) can help you make a meal plan that is best for you.  Keep in mind that carbohydrates and alcohol have immediate effects on your blood glucose levels. It is important to count carbohydrates and to use alcohol carefully. This information is not intended to replace advice given to you by your health care provider. Make sure you discuss any questions you have with your health care provider. Document Released: 10/17/2004 Document Revised: 02/25/2016 Document Reviewed: 02/25/2016 Elsevier Interactive Patient Education  2018 [employer].    Vitamin D Deficiency Vitamin D deficiency is when your body does not have enough vitamin D. Vitamin D is important to your body for many reasons:  It helps the body to absorb two important minerals, called calcium and phosphorus.  It plays a role in bone health.  It may help to prevent some  diseases, such as diabetes and multiple sclerosis.  It plays a role in muscle function, including heart function.  You can get vitamin D by:  Eating foods that naturally contain vitamin D.  Eating or drinking milk or other dairy products that have vitamin D added to them.  Taking a vitamin D supplement or a multivitamin supplement that contains vitamin D.  Being in the sun. Your body naturally makes vitamin D when your skin is exposed to sunlight. Your body changes the sunlight into a form of the vitamin that the body can use.  If vitamin D deficiency is severe, it can cause a condition in which your bones become soft. In adults, this condition is called  osteomalacia. In children, this condition is called rickets. What are the causes? Vitamin D deficiency may be caused by:  Not eating enough foods that contain vitamin D.  Not getting enough sun exposure.  Having certain digestive system diseases that make it difficult for your body to absorb vitamin D. These diseases include Crohn disease, chronic pancreatitis, and cystic fibrosis.  Having a surgery in which a part of the stomach or a part of the small intestine is removed.  Being obese.  Having chronic kidney disease or liver disease.  What increases the risk? This condition is more likely to develop in:  Older people.  People who do not spend much time outdoors.  People who live in a long-term care facility.  People who have had broken bones.  People with weak or thin bones (osteoporosis).  People who have a disease or condition that changes how the body absorbs vitamin D.  People who have dark skin.  People who take certain medicines, such as steroid medicines or certain seizure medicines.  People who are overweight or obese.  What are the signs or symptoms? In mild cases of vitamin D deficiency, there may not be any symptoms. If the condition is severe, symptoms may include:  Bone pain.  Muscle pain.  Falling often.  Broken bones caused by a minor injury.  How is this diagnosed? This condition is usually diagnosed with a blood test. How is this treated? Treatment for this condition may depend on what caused the condition. Treatment options include:  Taking vitamin D supplements.  Taking a calcium supplement. Your health care provider will suggest what dose is best for you.  Follow these instructions at home:  Take medicines and supplements only as told by your health care provider.  Eat foods that contain vitamin D. Choices include: ? Fortified dairy products, cereals, or juices. Fortified means that vitamin D has been added to the food. Check the  label on the package to be sure. ? Fatty fish, such as salmon or trout. ? Eggs. ? Oysters.  Do not use a tanning bed.  Maintain a healthy weight. Lose weight, if needed.  Keep all follow-up visits as told by your health care provider. This is important. Contact a health care provider if:  Your symptoms do not go away.  You feel like throwing up (nausea) or you throw up (vomit).  You have fewer bowel movements than usual or it is difficult for you to have a bowel movement (constipation). This information is not intended to replace advice given to you by your health care provider. Make sure you discuss any questions you have with your health care provider. Document Released: 04/14/2011 Document Revised: 07/04/2015 Document Reviewed: 06/07/2014 Elsevier Interactive Patient Education  2018 [employer].

## 2017-11-10 NOTE — Progress Notes (Signed)
Pre visit review using our clinic review tool, if applicable. No additional management support is needed unless otherwise documented below in the visit note. 

## 2018-01-05 ENCOUNTER — Other Ambulatory Visit: Payer: Self-pay | Admitting: Internal Medicine

## 2018-01-05 DIAGNOSIS — I10 Essential (primary) hypertension: Secondary | ICD-10-CM

## 2018-01-05 MED ORDER — TELMISARTAN-HCTZ 40-12.5 MG PO TABS: 1.0000 | ORAL_TABLET | Freq: Every day | ORAL | 3 refills | Status: DC

## 2018-02-22 ENCOUNTER — Ambulatory Visit
Admission: RE | Admit: 2018-02-22 | Discharge: 2018-02-22 | Disposition: A | Discharge status: Home / Self Care | Payer: Medicare Other | Source: Ambulatory Visit | Attending: Internal Medicine | Admitting: Internal Medicine

## 2018-02-22 DIAGNOSIS — R29890 Loss of height: Secondary | ICD-10-CM

## 2018-02-22 DIAGNOSIS — E2839 Other primary ovarian failure: Secondary | ICD-10-CM

## 2018-02-22 DIAGNOSIS — Z1231 Encounter for screening mammogram for malignant neoplasm of breast: Secondary | ICD-10-CM | POA: Diagnosis not present

## 2018-02-22 DIAGNOSIS — Z13820 Encounter for screening for osteoporosis: Secondary | ICD-10-CM | POA: Diagnosis not present

## 2018-03-03 ENCOUNTER — Other Ambulatory Visit: Payer: Self-pay | Admitting: Internal Medicine

## 2018-03-03 DIAGNOSIS — E1165 Type 2 diabetes mellitus with hyperglycemia: Secondary | ICD-10-CM

## 2018-03-03 MED ORDER — METFORMIN HCL 1000 MG PO TABS: 1000.0000 mg | ORAL_TABLET | Freq: Two times a day (BID) | ORAL | 3 refills | Status: DC

## 2018-03-05 ENCOUNTER — Other Ambulatory Visit: Payer: Self-pay | Admitting: Internal Medicine

## 2018-03-05 DIAGNOSIS — E1165 Type 2 diabetes mellitus with hyperglycemia: Secondary | ICD-10-CM

## 2018-03-05 MED ORDER — METFORMIN HCL 1000 MG PO TABS: 1000.0000 mg | ORAL_TABLET | Freq: Two times a day (BID) | ORAL | 3 refills | Status: DC

## 2018-03-23 IMAGING — MG MM DIGITAL SCREENING BILAT W/ TOMO W/ CAD
8 of 12 series · 8 of 28 positions shown · non-contrast
Comparison: Previous exam(s).

CLINICAL DATA: Screening.

EXAM:
2D DIGITAL SCREENING BILATERAL MAMMOGRAM WITH CAD AND ADJUNCT TOMO

[R MLO]
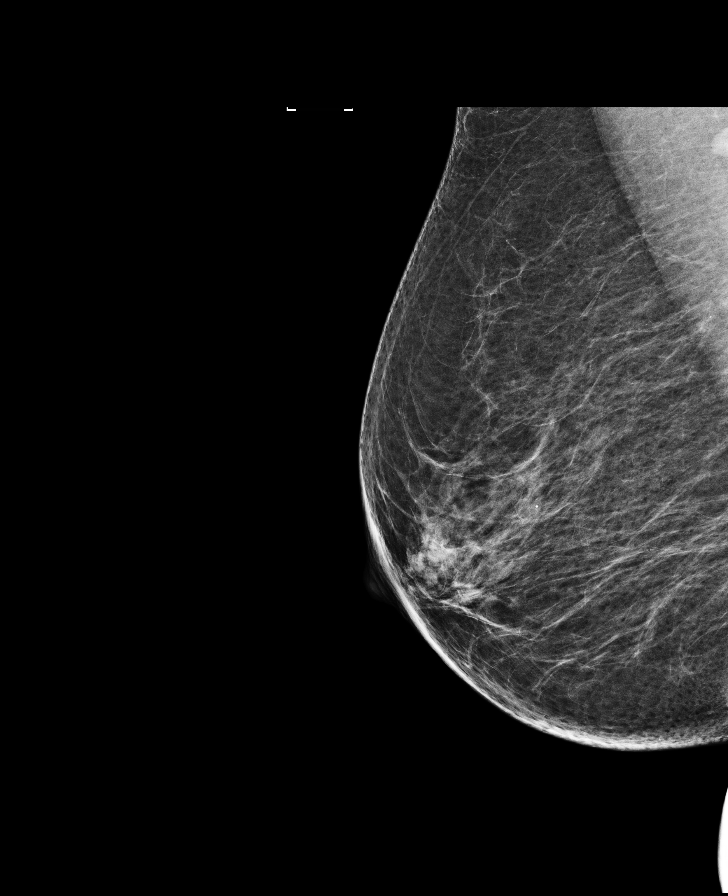

[L MLO]
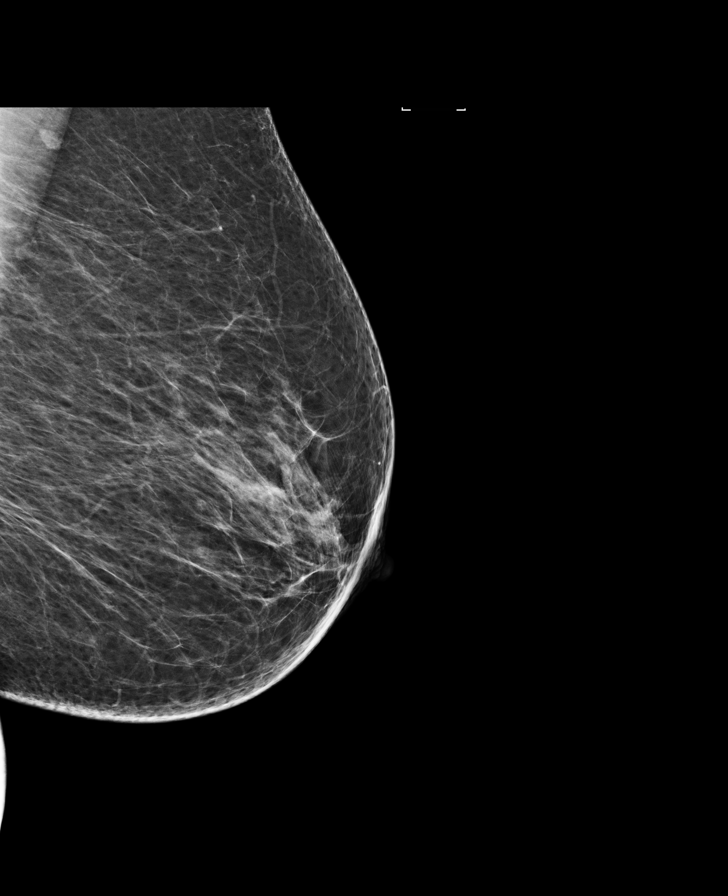

[L CC]
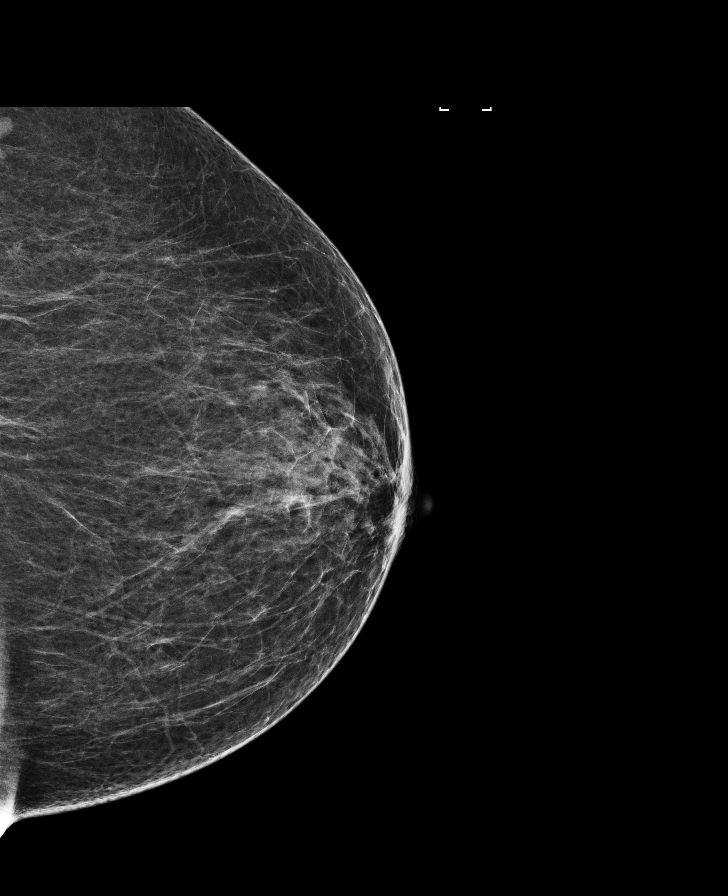

[R MLO synth-2D]
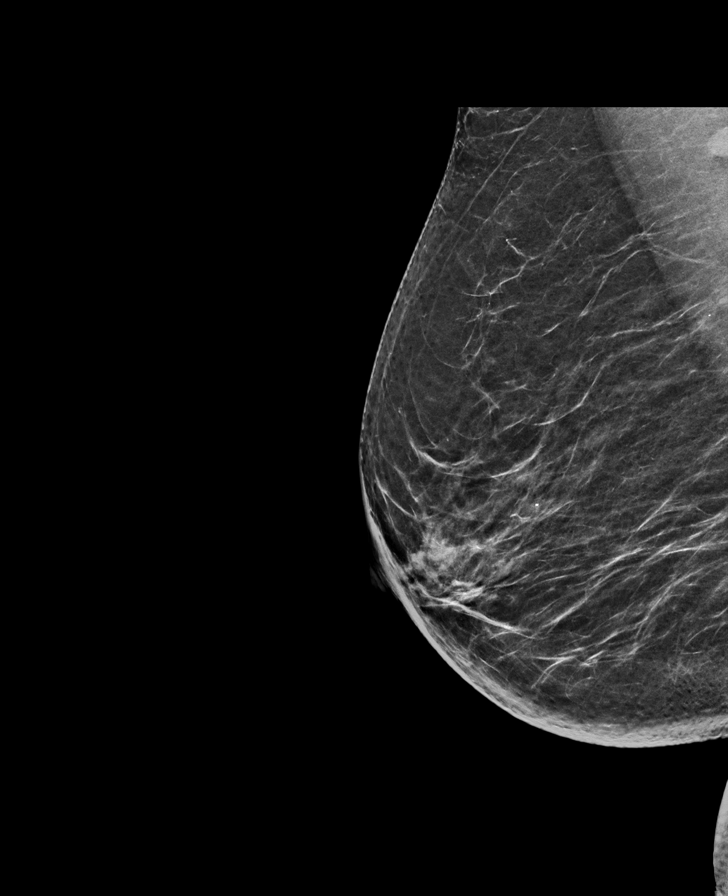

[L CC synth-2D]
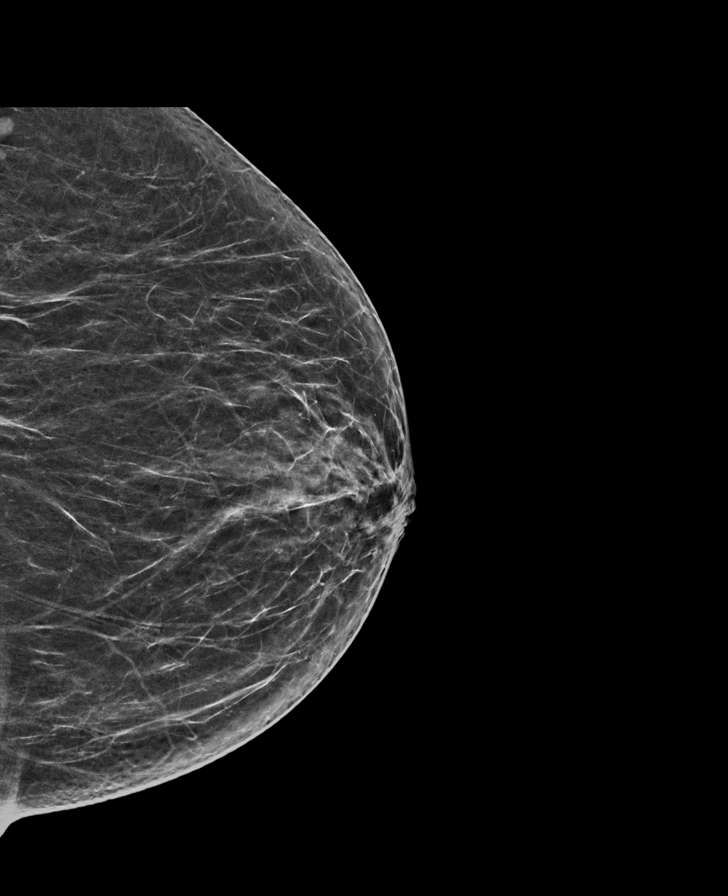

[R CC synth-2D]
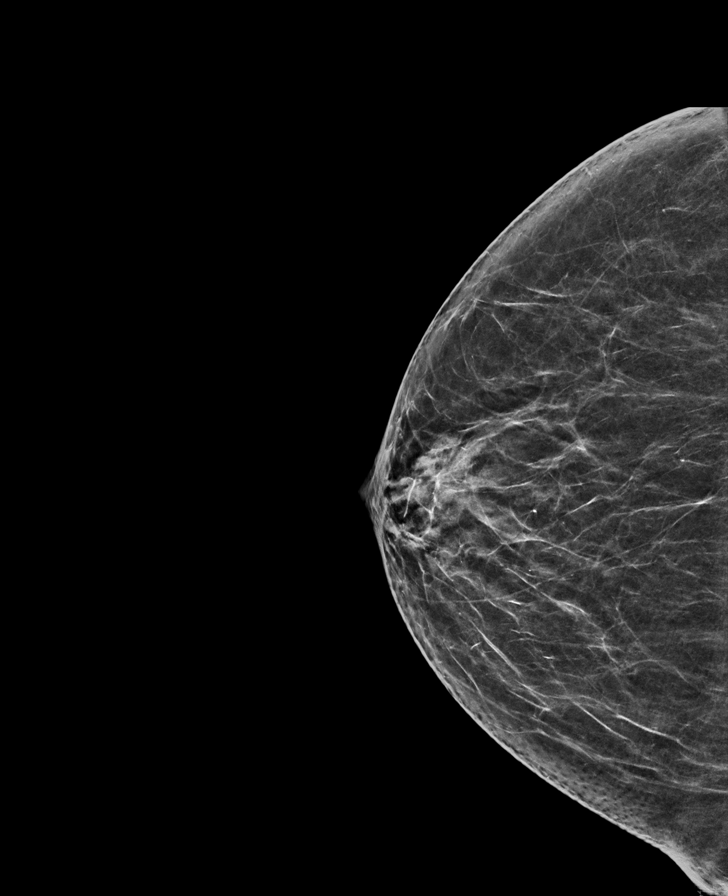

[R CC]
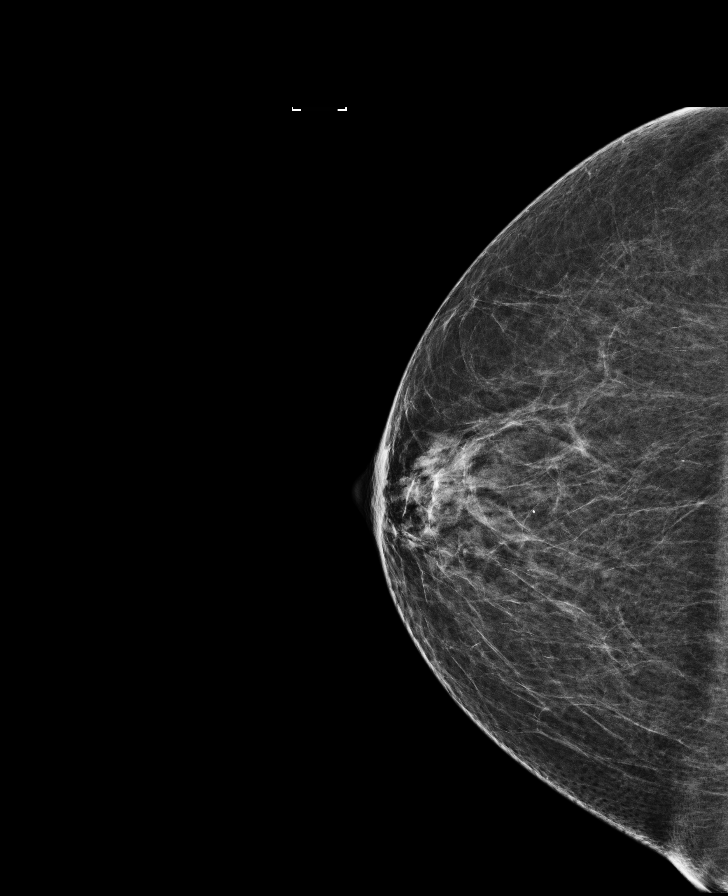

[L MLO synth-2D]
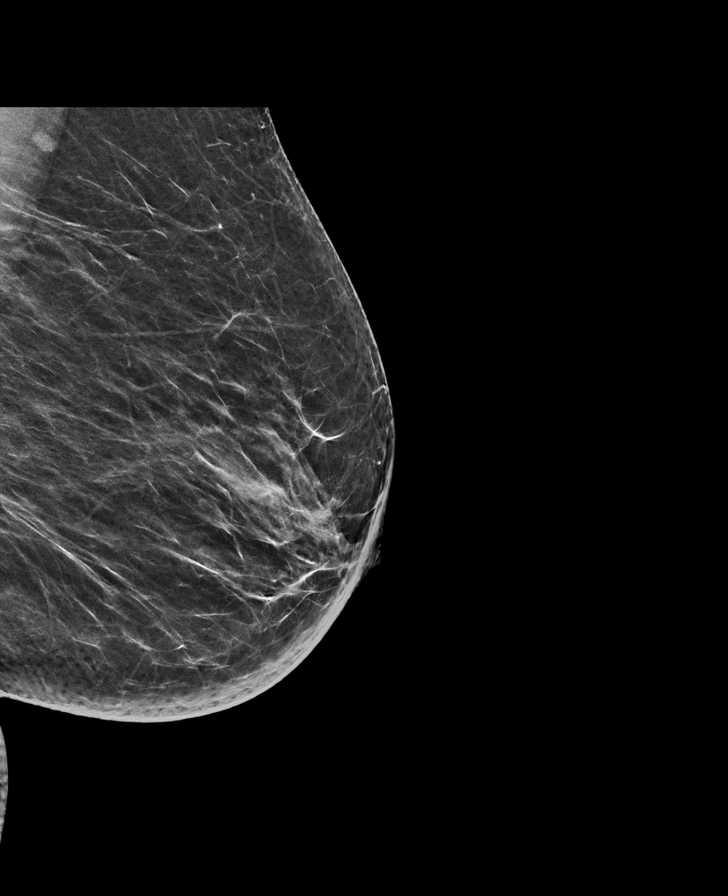

[8 of 28 positions shown; findings below may reference images not displayed]

ACR Breast Density Category b: There are scattered areas of
fibroglandular density.
FINDINGS: There are no findings suspicious for malignancy. Images were
processed with CAD.
IMPRESSION: No mammographic evidence of malignancy. A result letter of this
screening mammogram will be mailed directly to the patient.

RECOMMENDATION:
Screening mammogram in one year. (Code:97-6-RS4)

BI-RADS CATEGORY  1: Negative.

## 2018-05-12 ENCOUNTER — Ambulatory Visit (INDEPENDENT_AMBULATORY_CARE_PROVIDER_SITE_OTHER): Payer: Medicare Other | Admitting: Internal Medicine

## 2018-05-12 DIAGNOSIS — E785 Hyperlipidemia, unspecified: Secondary | ICD-10-CM | POA: Diagnosis not present

## 2018-05-12 DIAGNOSIS — E559 Vitamin D deficiency, unspecified: Secondary | ICD-10-CM | POA: Diagnosis not present

## 2018-05-12 DIAGNOSIS — I10 Essential (primary) hypertension: Secondary | ICD-10-CM | POA: Diagnosis not present

## 2018-05-12 DIAGNOSIS — E119 Type 2 diabetes mellitus without complications: Secondary | ICD-10-CM | POA: Diagnosis not present

## 2018-05-12 DIAGNOSIS — E118 Type 2 diabetes mellitus with unspecified complications: Secondary | ICD-10-CM

## 2018-05-12 DIAGNOSIS — Z1329 Encounter for screening for other suspected endocrine disorder: Secondary | ICD-10-CM

## 2018-05-12 MED ORDER — ATORVASTATIN CALCIUM 80 MG PO TABS: 80.0000 mg | ORAL_TABLET | Freq: Every day | ORAL | 3 refills | Status: DC

## 2018-05-12 MED ORDER — METOPROLOL SUCCINATE ER 25 MG PO TB24: ORAL_TABLET | ORAL | 3 refills | Status: DC

## 2018-05-12 NOTE — Progress Notes (Signed)
Virtual Visit via Video Note  I connected with Daivd Council on 05/12/18 at  9:30 AM EDT by a video enabled telemedicine application and verified that I am speaking with the correct person using two identifiers.  Location patient: home Location provider:work  Persons participating in the virtual visit: patient, provider, pts daughter and toddler granddaughter   I discussed the limitations of evaluation and management by telemedicine and the availability of in person appointments. The patient expressed understanding and agreed to proceed.   HPI: 1. HTn BP 124/82 at home on micardis hct 40-12.5 and toprol xl 25 mg qd  2. DM 2 A1c 7.5 on metformin 1000 mg bid cbg 114 6 am before breakfast 3. Vit D def taking D3 50K weekly    ROS: See pertinent positives and negatives per HPI.  Past Medical History:  Diagnosis Date  . Chicken pox   . Coronary artery disease   . Diabetes mellitus without complication ([place])   . Hyperlipidemia   . Hypertension   . Urine incontinence     Past Surgical History:  Procedure Laterality Date  . ABDOMINAL HYSTERECTOMY    . CARDIAC CATHETERIZATION  05/22/2000   ef 60%  . COLONOSCOPY WITH PROPOFOL N/A 07/14/2017   Procedure: COLONOSCOPY WITH PROPOFOL;  Surgeon: Jonathon Bellows, MD;  Location: [facility] ENDOSCOPY;  Service: Gastroenterology;  Laterality: N/A;  . CORONARY ARTERY BYPASS GRAFT     x3 vessel repair   . EYE SURGERY     [facility] [place]. Still has trouble with right eye vision though able to see peripheral vision   . US ECHOCARDIOGRAPHY  03/10/2007   EF 55-60%    Family History  Problem Relation Age of Onset  . Hypertension Mother   . Stroke Mother   . Diabetes Mother   . Heart attack Father   . Hypertension Father   . Heart attack Brother   . Breast cancer Neg Hx   . Cancer Neg Hx        FH lung cancer     SOCIAL HX: lives with daughter   Current Outpatient Medications:  .  aspirin 81 MG tablet, Take 81 mg  by mouth daily., Disp: , Rfl:  .  atorvastatin (LIPITOR) 80 MG tablet, Take 1 tablet (80 mg total) by mouth daily., Disp: 90 tablet, Rfl: 1 .  Cholecalciferol 50000 units capsule, Take 1 capsule (50,000 Units total) by mouth once a week., Disp: 13 capsule, Rfl: 1 .  metFORMIN (GLUCOPHAGE) 1000 MG tablet, Take 1 tablet (1,000 mg total) by mouth 2 (two) times daily., Disp: 180 tablet, Rfl: 3 .  metoprolol succinate (TOPROL-XL) 25 MG 24 hr tablet, TAKE ONE TABLET BY MOUTH DAILY, Disp: 90 tablet, Rfl: 3 .  Multiple Vitamin (MULTIVITAMIN) capsule, Take 1 capsule by mouth daily., Disp: , Rfl:  .  telmisartan-hydrochlorothiazide (MICARDIS HCT) 40-12.5 MG tablet, Take 1 tablet by mouth daily., Disp: 90 tablet, Rfl: 3  EXAM:  VITALS per patient if applicable:  GENERAL: alert, oriented, appears well and in no acute distress  HEENT: atraumatic, conjunttiva clear, no obvious abnormalities on inspection of external nose and ears  NECK: normal movements of the head and neck  LUNGS: on inspection no signs of respiratory distress, breathing rate appears normal, no obvious gross SOB, gasping or wheezing  CV: no obvious cyanosis  MS: moves all visible extremities without noticeable abnormality  PSYCH/NEURO: pleasant and cooperative, no obvious depression or anxiety, speech and thought processing grossly intact  ASSESSMENT  AND PLAN:  Discussed the following assessment and plan:  Essential hypertension  - Plan:  -cont meds BP improved  Comprehensive metabolic panel, CBC with Differential/Platelet, Lipid panel, Urinalysis, Routine w reflex microscopic, Microalbumin / creatinine urine ratio  Type 2 diabetes mellitus without complication, without long-term current use of insulin (HCC) - Plan: Hemoglobin A1c, Lipid panel, Urinalysis, Routine w reflex microscopic, Microalbumin / creatinine urine ratio -fasting labs 10/2018 and f/u after with PCP  -cont meds   Thyroid disorder screening - Plan:  TSH  Vitamin D deficiency - Plan: Vitamin D (25 hydroxy)-rec D3 5000 IU daily   Hyperlipidemia, unspecified hyperlipidemia type-cont meds   HM Flu shot utd  Prevnar11/20/18 pna 23 due in 1 year Pt needs Tdap in future and to disc shingrix -given Rx prev.  Will rec hep b vaccine in future  Hep C neg  MMR immune   02/22/18 mammogram negative No need for pap s/p hysterectomy  Colonoscopy had 07/2017 negative f/u in 10 years  02/22/18 DEXA normal low dose Ct screening for lung cancer in future  -does not qualify CT chest  -smoker x 25 years quit in 2001 max 1ppd no FH lung cancer     I discussed the assessment and treatment plan with the patient. The patient was provided an opportunity to ask questions and all were answered. The patient agreed with the plan and demonstrated an understanding of the instructions.   The patient was advised to call back or seek an in-person evaluation if the symptoms worsen or if the condition fails to improve as anticipated.  I provided 15 minutes of non-face-to-face time during this encounter.   Nino Glow McLean-Scocuzza, MD

## 2018-05-13 ENCOUNTER — Telehealth: Payer: Self-pay | Admitting: Internal Medicine

## 2018-05-13 NOTE — Telephone Encounter (Signed)
sch fasting labs 7 or 09/2018 and f/u with me 1 week after labs   Thanks TMS

## 2018-05-20 NOTE — Telephone Encounter (Signed)
Pt is scheduled °

## 2018-09-30 ENCOUNTER — Other Ambulatory Visit (INDEPENDENT_AMBULATORY_CARE_PROVIDER_SITE_OTHER): Payer: Medicare Other

## 2018-09-30 ENCOUNTER — Other Ambulatory Visit: Payer: Self-pay

## 2018-09-30 DIAGNOSIS — E559 Vitamin D deficiency, unspecified: Secondary | ICD-10-CM | POA: Diagnosis not present

## 2018-09-30 DIAGNOSIS — I10 Essential (primary) hypertension: Secondary | ICD-10-CM | POA: Diagnosis not present

## 2018-09-30 DIAGNOSIS — E119 Type 2 diabetes mellitus without complications: Secondary | ICD-10-CM

## 2018-09-30 DIAGNOSIS — Z1329 Encounter for screening for other suspected endocrine disorder: Secondary | ICD-10-CM

## 2018-09-30 LAB — CBC WITH DIFFERENTIAL/PLATELET
Basophils Absolute: 0 10*3/uL (ref 0.0–0.1)
Basophils Relative: 0.6 % (ref 0.0–3.0)
Eosinophils Absolute: 0.1 10*3/uL (ref 0.0–0.7)
Eosinophils Relative: 1.6 % (ref 0.0–5.0)
HCT: 34.8 % — ABNORMAL LOW (ref 36.0–46.0)
Hemoglobin: 11.4 g/dL — ABNORMAL LOW (ref 12.0–15.0)
Lymphocytes Relative: 36.2 % (ref 12.0–46.0)
Lymphs Abs: 2.3 10*3/uL (ref 0.7–4.0)
MCHC: 32.9 g/dL (ref 30.0–36.0)
MCV: 91.7 fl (ref 78.0–100.0)
Monocytes Absolute: 0.4 10*3/uL (ref 0.1–1.0)
Monocytes Relative: 6.9 % (ref 3.0–12.0)
Neutro Abs: 3.5 10*3/uL (ref 1.4–7.7)
Neutrophils Relative %: 54.7 % (ref 43.0–77.0)
Platelets: 282 10*3/uL (ref 150.0–400.0)
RBC: 3.79 Mil/uL — ABNORMAL LOW (ref 3.87–5.11)
RDW: 14.5 % (ref 11.5–15.5)
WBC: 6.5 10*3/uL (ref 4.0–10.5)

## 2018-09-30 LAB — COMPREHENSIVE METABOLIC PANEL
ALT: 13 U/L (ref 0–35)
AST: 15 U/L (ref 0–37)
Albumin: 4.1 g/dL (ref 3.5–5.2)
Alkaline Phosphatase: 76 U/L (ref 39–117)
BUN: 30 mg/dL — ABNORMAL HIGH (ref 6–23)
CO2: 28 mEq/L (ref 19–32)
Calcium: 9.6 mg/dL (ref 8.4–10.5)
Chloride: 102 mEq/L (ref 96–112)
Creatinine, Ser: 1.18 mg/dL (ref 0.40–1.20)
GFR: 54.39 mL/min — ABNORMAL LOW (ref >60.00)
Glucose, Bld: 139 mg/dL — ABNORMAL HIGH (ref 70–99)
Potassium: 4.2 mEq/L (ref 3.5–5.1)
Sodium: 140 mEq/L (ref 135–145)
Total Bilirubin: 0.4 mg/dL (ref 0.2–1.2)
Total Protein: 6.8 g/dL (ref 6.0–8.3)

## 2018-09-30 LAB — TIQ-MISC

## 2018-09-30 LAB — HEMOGLOBIN A1C: Hgb A1c MFr Bld: 7.7 % — ABNORMAL HIGH (ref 4.6–6.5)

## 2018-09-30 LAB — LIPID PANEL
Cholesterol: 126 mg/dL (ref 0–200)
HDL: 33.2 mg/dL — ABNORMAL LOW (ref >39.00)
LDL Cholesterol: 73 mg/dL (ref 0–99)
NonHDL: 92.81
Total CHOL/HDL Ratio: 4
Triglycerides: 100 mg/dL (ref 0.0–149.0)
VLDL: 20 mg/dL (ref 0.0–40.0)

## 2018-09-30 LAB — TSH: TSH: 4 u[IU]/mL (ref 0.35–4.50)

## 2018-09-30 LAB — VITAMIN D 25 HYDROXY (VIT D DEFICIENCY, FRACTURES): VITD: 35.92 ng/mL (ref 30.00–100.00)

## 2018-09-30 NOTE — Addendum Note (Signed)
Addended by: Leeanne Rio on: 09/30/2018 08:27 AM   Modules accepted: Orders

## 2018-10-07 ENCOUNTER — Ambulatory Visit (INDEPENDENT_AMBULATORY_CARE_PROVIDER_SITE_OTHER): Payer: Medicare Other | Admitting: Internal Medicine

## 2018-10-07 ENCOUNTER — Encounter: Payer: Self-pay | Admitting: Internal Medicine

## 2018-10-07 ENCOUNTER — Other Ambulatory Visit: Payer: Self-pay

## 2018-10-07 DIAGNOSIS — Z1389 Encounter for screening for other disorder: Secondary | ICD-10-CM | POA: Diagnosis not present

## 2018-10-07 DIAGNOSIS — E559 Vitamin D deficiency, unspecified: Secondary | ICD-10-CM

## 2018-10-07 DIAGNOSIS — E118 Type 2 diabetes mellitus with unspecified complications: Secondary | ICD-10-CM | POA: Diagnosis not present

## 2018-10-07 NOTE — Progress Notes (Signed)
Virtual Visit via Video Note  I connected with Haley Curtis   on 10/07/18 at  9:10 AM EDT by a video enabled telemedicine application and verified that I am speaking with the correct person using two identifiers.  Location patient: home Location provider:work or home office Persons participating in the virtual visit: patient, provider, pts daughter   I discussed the limitations of evaluation and management by telemedicine and the availability of in person appointments. The patient expressed understanding and agreed to proceed.   HPI: 1. DM A1C  7.7 pt reports eating cookies, cake, ice cream at times and will try to do better for now declines another med I.e glipizide  Declines to see nutrition  2. Reviewed BUN elevated 30 normal 23 she is not drinking enough water  3. Vitamin D normal she is taking D3 1000 IU daily rec increase to 2000 iU qd    ROS: See pertinent positives and negatives per HPI.  Past Medical History:  Diagnosis Date  . Chicken pox   . Coronary artery disease   . Diabetes mellitus without complication ([place])   . Hyperlipidemia   . Hypertension   . Urine incontinence     Past Surgical History:  Procedure Laterality Date  . ABDOMINAL HYSTERECTOMY    . CARDIAC CATHETERIZATION  05/22/2000   ef 60%  . COLONOSCOPY WITH PROPOFOL N/A 07/14/2017   Procedure: COLONOSCOPY WITH PROPOFOL;  Surgeon: Jonathon Bellows, MD;  Location: [facility] ENDOSCOPY;  Service: Gastroenterology;  Laterality: N/A;  . CORONARY ARTERY BYPASS GRAFT     x3 vessel repair   . EYE SURGERY     detached [facility] follows [facility] in [place]. Still has trouble with right eye vision though able to see peripheral vision   . US ECHOCARDIOGRAPHY  03/10/2007   EF 55-60%    Family History  Problem Relation Age of Onset  . Hypertension Mother   . Stroke Mother   . Diabetes Mother   . Heart attack Father   . Hypertension Father   . Heart attack Brother   . Breast cancer Neg Hx   . Cancer Neg Hx         FH lung cancer     SOCIAL HX: Former smoker  Lives with daughter and grandaughter    Current Outpatient Medications:  .  aspirin 81 MG tablet, Take 81 mg by mouth daily., Disp: , Rfl:  .  atorvastatin (LIPITOR) 80 MG tablet, Take 1 tablet (80 mg total) by mouth daily., Disp: 90 tablet, Rfl: 3 .  Cholecalciferol 50000 units capsule, Take 1 capsule (50,000 Units total) by mouth once a week., Disp: 13 capsule, Rfl: 1 .  metFORMIN (GLUCOPHAGE) 1000 MG tablet, Take 1 tablet (1,000 mg total) by mouth 2 (two) times daily., Disp: 180 tablet, Rfl: 3 .  metoprolol succinate (TOPROL-XL) 25 MG 24 hr tablet, TAKE ONE TABLET BY MOUTH DAILY, Disp: 90 tablet, Rfl: 3 .  Multiple Vitamin (MULTIVITAMIN) capsule, Take 1 capsule by mouth daily., Disp: , Rfl:  .  telmisartan-hydrochlorothiazide (MICARDIS HCT) 40-12.5 MG tablet, Take 1 tablet by mouth daily., Disp: 90 tablet, Rfl: 3  EXAM:  VITALS per patient if applicable:  GENERAL: alert, oriented, appears well and in no acute distress  HEENT: atraumatic, conjunttiva clear, no obvious abnormalities on inspection of external nose and ears  NECK: normal movements of the head and neck  LUNGS: on inspection no signs of respiratory distress, breathing rate appears normal, no obvious gross SOB, gasping or wheezing  CV:  no obvious cyanosis  MS: moves all visible extremities without noticeable abnormality  PSYCH/NEURO: pleasant and cooperative, no obvious depression or anxiety, speech and thought processing grossly intact  ASSESSMENT AND PLAN:  Discussed the following assessment and plan:  DM (diabetes mellitus), type 2 with complications (HCC) -cont metformin 1000 mg bid  -consider add glipizide in 3-4 months if A1C not at goal < 7  On ARB, statin  -counseled on healthy diet choices declines nutrition  -check feet and eye exam at f/u  Urine lab pending   Vitamin D deficiency -rec 2000 IU qd   HM Flu shotpt will call back to schedule   Prevnar11/20/18 pna 23 due in 1 year Pt needs Tdap in future and to disc shingrix -given Rx prev. has but not had  Will rec hep b vaccine in future  Hep C neg MMR immune  02/22/18 mammogram negative  No need for pap s/p hysterectomy  Colonoscopy had 07/2017 negative f/u in 10 years  02/22/18 DEXAnormal  low dose Ct screening for lung cancer  -does not qualify CT chest  -smoker x 25 years quit in 2001 max 1ppd no FH lung cancer   I discussed the assessment and treatment plan with the patient. The patient was provided an opportunity to ask questions and all were answered. The patient agreed with the plan and demonstrated an understanding of the instructions.   The patient was advised to call back or seek an in-person evaluation if the symptoms worsen or if the condition fails to improve as anticipated.  Time spent 16 minutes  Delorise Jackson, MD

## 2018-10-07 NOTE — Patient Instructions (Signed)

## 2018-10-08 LAB — MICROALBUMIN / CREATININE URINE RATIO
Creatinine, Urine: 266 mg/dL (ref 20–275)
Microalb Creat Ratio: 5 mcg/mg creat (ref <30)
Microalb, Ur: 1.2 mg/dL

## 2018-10-20 ENCOUNTER — Ambulatory Visit (INDEPENDENT_AMBULATORY_CARE_PROVIDER_SITE_OTHER): Payer: Medicare Other

## 2018-10-20 ENCOUNTER — Other Ambulatory Visit: Payer: Self-pay

## 2018-10-20 DIAGNOSIS — Z23 Encounter for immunization: Secondary | ICD-10-CM

## 2019-01-04 ENCOUNTER — Other Ambulatory Visit: Payer: Self-pay | Admitting: Internal Medicine

## 2019-01-04 DIAGNOSIS — I10 Essential (primary) hypertension: Secondary | ICD-10-CM

## 2019-01-04 MED ORDER — TELMISARTAN-HCTZ 40-12.5 MG PO TABS: 1.0000 | ORAL_TABLET | Freq: Every day | ORAL | 3 refills | Status: DC

## 2019-01-05 ENCOUNTER — Other Ambulatory Visit: Payer: Self-pay

## 2019-01-07 ENCOUNTER — Other Ambulatory Visit: Payer: Self-pay

## 2019-01-07 ENCOUNTER — Other Ambulatory Visit (INDEPENDENT_AMBULATORY_CARE_PROVIDER_SITE_OTHER): Payer: Medicare Other

## 2019-01-07 DIAGNOSIS — Z1389 Encounter for screening for other disorder: Secondary | ICD-10-CM

## 2019-01-07 DIAGNOSIS — E118 Type 2 diabetes mellitus with unspecified complications: Secondary | ICD-10-CM

## 2019-01-07 LAB — BASIC METABOLIC PANEL
BUN: 20 mg/dL (ref 6–23)
CO2: 27 mEq/L (ref 19–32)
Calcium: 9.5 mg/dL (ref 8.4–10.5)
Chloride: 99 mEq/L (ref 96–112)
Creatinine, Ser: 1.18 mg/dL (ref 0.40–1.20)
GFR: 54.34 mL/min — ABNORMAL LOW (ref >60.00)
Glucose, Bld: 132 mg/dL — ABNORMAL HIGH (ref 70–99)
Potassium: 4.3 mEq/L (ref 3.5–5.1)
Sodium: 138 mEq/L (ref 135–145)

## 2019-01-07 LAB — HEMOGLOBIN A1C: Hgb A1c MFr Bld: 7.5 % — ABNORMAL HIGH (ref 4.6–6.5)

## 2019-01-08 LAB — URINALYSIS, ROUTINE W REFLEX MICROSCOPIC
Bilirubin Urine: NEGATIVE
Glucose, UA: NEGATIVE
Hgb urine dipstick: NEGATIVE
Ketones, ur: NEGATIVE
Nitrite: NEGATIVE
Protein, ur: NEGATIVE
Specific Gravity, Urine: 1.019 (ref 1.001–1.03)
pH: 5.5 (ref 5.0–8.0)

## 2019-01-13 ENCOUNTER — Ambulatory Visit (INDEPENDENT_AMBULATORY_CARE_PROVIDER_SITE_OTHER): Payer: Medicare Other | Admitting: Internal Medicine

## 2019-01-13 ENCOUNTER — Other Ambulatory Visit: Payer: Self-pay | Admitting: Internal Medicine

## 2019-01-13 ENCOUNTER — Encounter: Payer: Self-pay | Admitting: Internal Medicine

## 2019-01-13 ENCOUNTER — Telehealth: Payer: Self-pay

## 2019-01-13 VITALS — BP 118/85 | HR 79 | Ht 65.0 in | Wt 216.0 lb

## 2019-01-13 DIAGNOSIS — E1165 Type 2 diabetes mellitus with hyperglycemia: Secondary | ICD-10-CM

## 2019-01-13 DIAGNOSIS — I10 Essential (primary) hypertension: Secondary | ICD-10-CM | POA: Diagnosis not present

## 2019-01-13 DIAGNOSIS — E785 Hyperlipidemia, unspecified: Secondary | ICD-10-CM

## 2019-01-13 DIAGNOSIS — E118 Type 2 diabetes mellitus with unspecified complications: Secondary | ICD-10-CM

## 2019-01-13 DIAGNOSIS — Z1231 Encounter for screening mammogram for malignant neoplasm of breast: Secondary | ICD-10-CM

## 2019-01-13 MED ORDER — GLIPIZIDE ER 2.5 MG PO TB24: 2.5000 mg | ORAL_TABLET | Freq: Every day | ORAL | 3 refills | Status: DC

## 2019-01-13 NOTE — Telephone Encounter (Signed)
-----   Message from Delorise Jackson, MD sent at 01/13/2019  9:40 AM EST ----- Pna 23 vaccine due sch appt Does pt want to have mammogram in [place] or [place] like last year went to [place]?

## 2019-01-13 NOTE — Telephone Encounter (Signed)
Patient & daughter informed that they can call [facility] to schedule.

## 2019-01-13 NOTE — Telephone Encounter (Signed)
They can call [facility] to schedule  [place]

## 2019-01-13 NOTE — Progress Notes (Signed)
Virtual Visit via Video Note  I connected with Haley Curtis  on 01/13/19 at  9:00 AM EST by a video enabled telemedicine application and verified that I am speaking with the correct person using two identifiers.  Location patient: home Location provider:work or home office Persons participating in the virtual visit: patient, provider, pts daughter  I discussed the limitations of evaluation and management by telemedicine and the availability of in person appointments. The patient expressed understanding and agreed to proceed.   HPI: 1. DM 2 A1C 7.5 on metformin agreeable to try glipizide she likes skins and crackers and eats some at times  2.HTN 118/85 w/o meds today advised to drink 50 ounces water  3. mammo due 02/2019 4. She is feeling well and no complaints today     ROS: See pertinent positives and negatives per HPI.  Past Medical History:  Diagnosis Date  . Chicken pox   . Coronary artery disease   . Diabetes mellitus without complication ([place])   . Hyperlipidemia   . Hypertension   . Urine incontinence     Past Surgical History:  Procedure Laterality Date  . ABDOMINAL HYSTERECTOMY    . CARDIAC CATHETERIZATION  05/22/2000   ef 60%  . COLONOSCOPY WITH PROPOFOL N/A 07/14/2017   Procedure: COLONOSCOPY WITH PROPOFOL;  Surgeon: Jonathon Bellows, MD;  Location: [facility];  Service: Gastroenterology;  Laterality: N/A;  . CORONARY ARTERY BYPASS GRAFT     x3 vessel repair   . EYE SURGERY     detached retina follows [facility] in [place]. Still has trouble with right eye vision though able to see peripheral vision   . US ECHOCARDIOGRAPHY  03/10/2007   EF 55-60%    Family History  Problem Relation Age of Onset  . Hypertension Mother   . Stroke Mother   . Diabetes Mother   . Heart attack Father   . Hypertension Father   . Heart attack Brother   . Breast cancer Neg Hx   . Cancer Neg Hx        FH lung cancer     SOCIAL HX:  Former smoker  Lives with daughter and  kids and son in law    Current Outpatient Medications:  .  aspirin 81 MG tablet, Take 81 mg by mouth daily., Disp: , Rfl:  .  atorvastatin (LIPITOR) 80 MG tablet, Take 1 tablet (80 mg total) by mouth daily., Disp: 90 tablet, Rfl: 3 .  Cholecalciferol 50 MCG (2000 UT) TABS, Take 1 tablet by mouth daily., Disp: , Rfl:  .  metFORMIN (GLUCOPHAGE) 1000 MG tablet, Take 1 tablet (1,000 mg total) by mouth 2 (two) times daily., Disp: 180 tablet, Rfl: 3 .  metoprolol succinate (TOPROL-XL) 25 MG 24 hr tablet, TAKE ONE TABLET BY MOUTH DAILY, Disp: 90 tablet, Rfl: 3 .  Multiple Vitamin (MULTIVITAMIN) capsule, Take 1 capsule by mouth daily., Disp: , Rfl:  .  telmisartan-hydrochlorothiazide (MICARDIS HCT) 40-12.5 MG tablet, Take 1 tablet by mouth daily., Disp: 90 tablet, Rfl: 3 .  glipiZIDE (GLUCOTROL XL) 2.5 MG 24 hr tablet, Take 1 tablet (2.5 mg total) by mouth daily with breakfast., Disp: 90 tablet, Rfl: 3  EXAM:  VITALS per patient if applicable:  GENERAL: alert, oriented, appears well and in no acute distress  HEENT: atraumatic, conjunttiva clear, no obvious abnormalities on inspection of external nose and ears  NECK: normal movements of the head and neck  LUNGS: on inspection no signs of respiratory distress, breathing rate appears normal,  no obvious gross SOB, gasping or wheezing  CV: no obvious cyanosis  MS: moves all visible extremities without noticeable abnormality  PSYCH/NEURO: pleasant and cooperative, no obvious depression or anxiety, speech and thought processing grossly intact  ASSESSMENT AND PLAN:  Discussed the following assessment and plan:  Type 2 diabetes mellitus with hyperglycemia, without long-term current use of insulin (HCC) - Plan: glipiZIDE (GLUCOTROL XL) 2.5 MG 24 hr tablet qd  A1c 7.5 Cont metformin  F/u in 6 months Cont meds ARB/statin Encouraged to call and sch eye MD appt Will do foot exam at f/u  Essential hypertension Cont meds increase water to 50 oz  qd  Continue exercise on new bike   Hyperlipidemia, unspecified hyperlipidemia type  HM Flu shotutd  Prevnar11/20/18 pna 23 due sch office visit  Pt needs Tdap in future and to disc shingrix -given Rx prev.  Will rec hep b vaccine in future  Hep C neg MMR immune  02/22/18 mammogram negative ordered  No need for pap s/p hysterectomy Colonoscopy had 07/2017 negative f/u in 10 years 02/22/18 DEXAnormal low dose Ct screening for lung cancer in future -does not qualify CT chest  -smoker x 25 years quit in 2001 max 1ppd no FH lung cancer  -we discussed possible serious and likely etiologies, options for evaluation and workup, limitations of telemedicine visit vs in person visit, treatment, treatment risks and precautions. Pt prefers to treat via telemedicine empirically rather then risking or undertaking an in person visit at this moment. Patient agrees to seek prompt in person care if worsening, new symptoms arise, or if is not improving with treatment.   I discussed the assessment and treatment plan with the patient. The patient was provided an opportunity to ask questions and all were answered. The patient agreed with the plan and demonstrated an understanding of the instructions.   The patient was advised to call back or seek an in-person evaluation if the symptoms worsen or if the condition fails to improve as anticipated.  Time spent 15 minutes  Delorise Jackson, MD

## 2019-01-14 ENCOUNTER — Other Ambulatory Visit: Payer: Self-pay

## 2019-01-19 ENCOUNTER — Other Ambulatory Visit: Payer: Self-pay

## 2019-01-19 ENCOUNTER — Ambulatory Visit (INDEPENDENT_AMBULATORY_CARE_PROVIDER_SITE_OTHER): Payer: Medicare Other

## 2019-01-19 DIAGNOSIS — Z23 Encounter for immunization: Secondary | ICD-10-CM | POA: Diagnosis not present

## 2019-01-19 NOTE — Progress Notes (Signed)
Agree with pna 23 vx today  [place]

## 2019-02-01 ENCOUNTER — Encounter: Payer: Self-pay | Admitting: Internal Medicine

## 2019-03-02 ENCOUNTER — Other Ambulatory Visit: Payer: Self-pay | Admitting: Internal Medicine

## 2019-03-02 DIAGNOSIS — I10 Essential (primary) hypertension: Secondary | ICD-10-CM

## 2019-03-02 DIAGNOSIS — E785 Hyperlipidemia, unspecified: Secondary | ICD-10-CM

## 2019-03-02 MED ORDER — METOPROLOL SUCCINATE ER 25 MG PO TB24: ORAL_TABLET | ORAL | 3 refills | Status: DC

## 2019-03-02 MED ORDER — ATORVASTATIN CALCIUM 80 MG PO TABS: 80.0000 mg | ORAL_TABLET | Freq: Every day | ORAL | 3 refills | Status: DC

## 2019-03-04 ENCOUNTER — Ambulatory Visit
Admission: RE | Admit: 2019-03-04 | Discharge: 2019-03-04 | Disposition: A | Discharge status: Home / Self Care | Payer: Medicare Other | Source: Ambulatory Visit | Attending: Internal Medicine | Admitting: Internal Medicine

## 2019-03-04 DIAGNOSIS — Z1231 Encounter for screening mammogram for malignant neoplasm of breast: Secondary | ICD-10-CM | POA: Insufficient documentation

## 2019-03-07 ENCOUNTER — Other Ambulatory Visit: Payer: Self-pay | Admitting: Internal Medicine

## 2019-03-07 DIAGNOSIS — N6489 Other specified disorders of breast: Secondary | ICD-10-CM

## 2019-03-07 DIAGNOSIS — R928 Other abnormal and inconclusive findings on diagnostic imaging of breast: Secondary | ICD-10-CM

## 2019-03-11 ENCOUNTER — Ambulatory Visit
Admission: RE | Admit: 2019-03-11 | Discharge: 2019-03-11 | Disposition: A | Discharge status: Home / Self Care | Payer: Medicare Other | Source: Ambulatory Visit | Attending: Internal Medicine | Admitting: Internal Medicine

## 2019-03-11 DIAGNOSIS — N6489 Other specified disorders of breast: Secondary | ICD-10-CM

## 2019-03-11 DIAGNOSIS — R928 Other abnormal and inconclusive findings on diagnostic imaging of breast: Secondary | ICD-10-CM

## 2019-04-11 DIAGNOSIS — E119 Type 2 diabetes mellitus without complications: Secondary | ICD-10-CM | POA: Diagnosis not present

## 2019-04-13 LAB — HM DIABETES EYE EXAM

## 2019-04-18 ENCOUNTER — Encounter: Payer: Self-pay | Admitting: Internal Medicine

## 2019-05-31 ENCOUNTER — Other Ambulatory Visit: Payer: Self-pay | Admitting: Internal Medicine

## 2019-05-31 DIAGNOSIS — E1165 Type 2 diabetes mellitus with hyperglycemia: Secondary | ICD-10-CM

## 2019-05-31 MED ORDER — METFORMIN HCL 1000 MG PO TABS: 1000.0000 mg | ORAL_TABLET | Freq: Two times a day (BID) | ORAL | 3 refills | Status: DC

## 2019-06-07 ENCOUNTER — Other Ambulatory Visit: Payer: Self-pay | Admitting: Internal Medicine

## 2019-06-07 DIAGNOSIS — E1165 Type 2 diabetes mellitus with hyperglycemia: Secondary | ICD-10-CM

## 2019-06-07 MED ORDER — METFORMIN HCL 1000 MG PO TABS: 1000.0000 mg | ORAL_TABLET | Freq: Two times a day (BID) | ORAL | 3 refills | Status: DC

## 2019-07-09 DIAGNOSIS — Z23 Encounter for immunization: Secondary | ICD-10-CM | POA: Diagnosis not present

## 2019-07-15 ENCOUNTER — Ambulatory Visit: Payer: Medicare Other | Admitting: Internal Medicine

## 2019-10-26 ENCOUNTER — Encounter: Payer: Self-pay | Admitting: Internal Medicine

## 2019-10-26 ENCOUNTER — Other Ambulatory Visit: Payer: Self-pay

## 2019-10-26 ENCOUNTER — Ambulatory Visit (INDEPENDENT_AMBULATORY_CARE_PROVIDER_SITE_OTHER): Payer: Medicare Other | Admitting: Internal Medicine

## 2019-10-26 VITALS — BP 128/84 | HR 95 | Temp 98.4°F | Ht 65.0 in | Wt 224.4 lb

## 2019-10-26 DIAGNOSIS — Z23 Encounter for immunization: Secondary | ICD-10-CM

## 2019-10-26 DIAGNOSIS — E559 Vitamin D deficiency, unspecified: Secondary | ICD-10-CM | POA: Diagnosis not present

## 2019-10-26 DIAGNOSIS — Z1329 Encounter for screening for other suspected endocrine disorder: Secondary | ICD-10-CM

## 2019-10-26 DIAGNOSIS — E1159 Type 2 diabetes mellitus with other circulatory complications: Secondary | ICD-10-CM | POA: Diagnosis not present

## 2019-10-26 DIAGNOSIS — I10 Essential (primary) hypertension: Secondary | ICD-10-CM

## 2019-10-26 DIAGNOSIS — I152 Hypertension secondary to endocrine disorders: Secondary | ICD-10-CM

## 2019-10-26 DIAGNOSIS — E1165 Type 2 diabetes mellitus with hyperglycemia: Secondary | ICD-10-CM

## 2019-10-26 DIAGNOSIS — E785 Hyperlipidemia, unspecified: Secondary | ICD-10-CM

## 2019-10-26 MED ORDER — TELMISARTAN-HCTZ 40-12.5 MG PO TABS: 1.0000 | ORAL_TABLET | Freq: Every day | ORAL | 3 refills | Status: DC

## 2019-10-26 MED ORDER — METOPROLOL SUCCINATE ER 25 MG PO TB24: ORAL_TABLET | ORAL | 3 refills | Status: DC

## 2019-10-26 MED ORDER — METFORMIN HCL 1000 MG PO TABS: 1000.0000 mg | ORAL_TABLET | Freq: Two times a day (BID) | ORAL | 3 refills | Status: DC

## 2019-10-26 MED ORDER — ATORVASTATIN CALCIUM 80 MG PO TABS: 80.0000 mg | ORAL_TABLET | Freq: Every day | ORAL | 3 refills | Status: DC

## 2019-10-26 MED ORDER — GLIPIZIDE ER 2.5 MG PO TB24: 2.5000 mg | ORAL_TABLET | Freq: Every day | ORAL | 3 refills | Status: DC

## 2019-10-26 NOTE — Patient Instructions (Addendum)
Beet supplements  Goal <130/<80  Consider shingrix vaccine 2nd dose 2 to < 6 months 1st   Increase water 55-64 ounces water daily   Reduce sugar intake please  Zoster Vaccine, Recombinant injection What is this medicine? ZOSTER VACCINE (ZOS ter vak SEEN) is used to prevent shingles in adults 73 years old and over. This vaccine is not used to treat shingles or nerve pain from shingles. This medicine may be used for other purposes; ask your health care provider or pharmacist if you have questions. COMMON BRAND NAME(S): [facility] What should I tell my health care provider before I take this medicine? They need to know if you have any of these conditions:  blood disorders or disease  cancer like leukemia or lymphoma  immune system problems or therapy  an unusual or allergic reaction to vaccines, other medications, foods, dyes, or preservatives  pregnant or trying to get pregnant  breast-feeding How should I use this medicine? This vaccine is for injection in a muscle. It is given by a health care professional. Talk to your pediatrician regarding the use of this medicine in children. This medicine is not approved for use in children. Overdosage: If you think you have taken too much of this medicine contact a poison control center or emergency room at once. NOTE: This medicine is only for you. Do not share this medicine with others. What if I miss a dose? Keep appointments for follow-up (booster) doses as directed. It is important not to miss your dose. Call your doctor or health care professional if you are unable to keep an appointment. What may interact with this medicine?  medicines that suppress your immune system  medicines to treat cancer  steroid medicines like prednisone or cortisone This list may not describe all possible interactions. Give your health care provider a list of all the medicines, herbs, non-prescription drugs, or dietary supplements you use. Also tell them  if you smoke, drink alcohol, or use illegal drugs. Some items may interact with your medicine. What should I watch for while using this medicine? Visit your doctor for regular check ups. This vaccine, like all vaccines, may not fully protect everyone. What side effects may I notice from receiving this medicine? Side effects that you should report to your doctor or health care professional as soon as possible:  allergic reactions like skin rash, itching or hives, swelling of the face, lips, or tongue  breathing problems Side effects that usually do not require medical attention (report these to your doctor or health care professional if they continue or are bothersome):  chills  headache  fever  nausea, vomiting  redness, warmth, pain, swelling or itching at site where injected  tiredness This list may not describe all possible side effects. Call your doctor for medical advice about side effects. You may report side effects to FDA at 1-800-FDA-1088. Where should I keep my medicine? This vaccine is only given in a clinic, pharmacy, doctor's office, or other health care setting and will not be stored at home. NOTE: This sheet is a summary. It may not cover all possible information. If you have questions about this medicine, talk to your doctor, pharmacist, or health care provider.  2020 Elsevier/Gold Standard (2016-09-01 13:20:30)   DASH Eating Plan DASH stands for "Dietary Approaches to Stop Hypertension." The DASH eating plan is a healthy eating plan that has been shown to reduce high blood pressure (hypertension). It may also reduce your risk for type 2 diabetes, heart disease, and  stroke. The DASH eating plan may also help with weight loss. What are tips for following this plan?  General guidelines  Avoid eating more than 2,300 mg (milligrams) of salt (sodium) a day. If you have hypertension, you may need to reduce your sodium intake to 1,500 mg a day.  Limit alcohol intake to  no more than 1 drink a day for nonpregnant women and 2 drinks a day for men. One drink equals 12 oz of beer, 5 oz of wine, or 1 oz of hard liquor.  Work with your health care provider to maintain a healthy body weight or to lose weight. Ask what an ideal weight is for you.  Get at least 30 minutes of exercise that causes your heart to beat faster (aerobic exercise) most days of the week. Activities may include walking, swimming, or biking.  Work with your health care provider or diet and nutrition specialist (dietitian) to adjust your eating plan to your individual calorie needs. Reading food labels   Check food labels for the amount of sodium per serving. Choose foods with less than 5 percent of the Daily Value of sodium. Generally, foods with less than 300 mg of sodium per serving fit into this eating plan.  To find whole grains, look for the word "whole" as the first word in the ingredient list. Shopping  Buy products labeled as "low-sodium" or "no salt added."  Buy fresh foods. Avoid canned foods and premade or frozen meals. Cooking  Avoid adding salt when cooking. Use salt-free seasonings or herbs instead of table salt or sea salt. Check with your health care provider or pharmacist before using salt substitutes.  Do not fry foods. Cook foods using healthy methods such as baking, boiling, grilling, and broiling instead.  Cook with heart-healthy oils, such as olive, canola, soybean, or sunflower oil. Meal planning  Eat a balanced diet that includes: ? 5 or more servings of fruits and vegetables each day. At each meal, try to fill half of your plate with fruits and vegetables. ? Up to 6-8 servings of whole grains each day. ? Less than 6 oz of lean meat, poultry, or fish each day. A 3-oz serving of meat is about the same size as a deck of cards. One egg equals 1 oz. ? 2 servings of low-fat dairy each day. ? A serving of nuts, seeds, or beans 5 times each week. ? Heart-healthy fats.  Healthy fats called Omega-3 fatty acids are found in foods such as flaxseeds and coldwater fish, like sardines, salmon, and mackerel.  Limit how much you eat of the following: ? Canned or prepackaged foods. ? Food that is high in trans fat, such as fried foods. ? Food that is high in saturated fat, such as fatty meat. ? Sweets, desserts, sugary drinks, and other foods with added sugar. ? Full-fat dairy products.  Do not salt foods before eating.  Try to eat at least 2 vegetarian meals each week.  Eat more home-cooked food and less restaurant, buffet, and fast food.  When eating at a restaurant, ask that your food be prepared with less salt or no salt, if possible. What foods are recommended? The items listed may not be a complete list. Talk with your dietitian about what dietary choices are best for you. Grains Whole-grain or whole-wheat bread. Whole-grain or whole-wheat pasta. Brown rice. Orpah Cobb. Bulgur. Whole-grain and low-sodium cereals. Pita bread. Low-fat, low-sodium crackers. Whole-wheat flour tortillas. Vegetables Fresh or frozen vegetables (raw, steamed, roasted, or  grilled). Low-sodium or reduced-sodium tomato and vegetable juice. Low-sodium or reduced-sodium tomato sauce and tomato paste. Low-sodium or reduced-sodium canned vegetables. Fruits All fresh, dried, or frozen fruit. Canned fruit in natural juice (without added sugar). Meat and other protein foods Skinless chicken or [place]. Ground chicken or [place]. Pork with fat trimmed off. Fish and seafood. Egg whites. Dried beans, peas, or lentils. Unsalted nuts, nut butters, and seeds. Unsalted canned beans. Lean cuts of beef with fat trimmed off. Low-sodium, lean deli meat. Dairy Low-fat (1%) or fat-free (skim) milk. Fat-free, low-fat, or reduced-fat cheeses. Nonfat, low-sodium ricotta or cottage cheese. Low-fat or nonfat yogurt. Low-fat, low-sodium cheese. Fats and oils Soft margarine without trans fats. Vegetable  oil. Low-fat, reduced-fat, or light mayonnaise and salad dressings (reduced-sodium). Canola, safflower, olive, soybean, and sunflower oils. Avocado. Seasoning and other foods Herbs. Spices. Seasoning mixes without salt. Unsalted popcorn and pretzels. Fat-free sweets. What foods are not recommended? The items listed may not be a complete list. Talk with your dietitian about what dietary choices are best for you. Grains Baked goods made with fat, such as croissants, muffins, or some breads. Dry pasta or rice meal packs. Vegetables Creamed or fried vegetables. Vegetables in a cheese sauce. Regular canned vegetables (not low-sodium or reduced-sodium). Regular canned tomato sauce and paste (not low-sodium or reduced-sodium). Regular tomato and vegetable juice (not low-sodium or reduced-sodium). Rosita Fire. Olives. Fruits Canned fruit in a light or heavy syrup. Fried fruit. Fruit in cream or butter sauce. Meat and other protein foods Fatty cuts of meat. Ribs. Fried meat. Tomasa Blase. Sausage. Bologna and other processed lunch meats. Salami. Fatback. Hotdogs. Bratwurst. Salted nuts and seeds. Canned beans with added salt. Canned or smoked fish. Whole eggs or egg yolks. Chicken or [place] with skin. Dairy Whole or 2% milk, cream, and half-and-half. Whole or full-fat cream cheese. Whole-fat or sweetened yogurt. Full-fat cheese. Nondairy creamers. Whipped toppings. Processed cheese and cheese spreads. Fats and oils Butter. Stick margarine. Lard. Shortening. Ghee. Bacon fat. Tropical oils, such as coconut, palm kernel, or palm oil. Seasoning and other foods Salted popcorn and pretzels. Onion salt, garlic salt, seasoned salt, table salt, and sea salt. Worcestershire sauce. Tartar sauce. Barbecue sauce. Teriyaki sauce. Soy sauce, including reduced-sodium. Steak sauce. Canned and packaged gravies. Fish sauce. Oyster sauce. Cocktail sauce. Horseradish that you find on the shelf. Ketchup. Mustard. Meat flavorings and  tenderizers. Bouillon cubes. Hot sauce and Tabasco sauce. Premade or packaged marinades. Premade or packaged taco seasonings. Relishes. Regular salad dressings. Where to find more information:  National Heart, Lung, and Blood Institute: PopSteam.is  American Heart Association: www.heart.org Summary  The DASH eating plan is a healthy eating plan that has been shown to reduce high blood pressure (hypertension). It may also reduce your risk for type 2 diabetes, heart disease, and stroke.  With the DASH eating plan, you should limit salt (sodium) intake to 2,300 mg a day. If you have hypertension, you may need to reduce your sodium intake to 1,500 mg a day.  When on the DASH eating plan, aim to eat more fresh fruits and vegetables, whole grains, lean proteins, low-fat dairy, and heart-healthy fats.  Work with your health care provider or diet and nutrition specialist (dietitian) to adjust your eating plan to your individual calorie needs. This information is not intended to replace advice given to you by your health care provider. Make sure you discuss any questions you have with your health care provider. Document Revised: 01/02/2017 Document Reviewed: 01/14/2016 Elsevier Patient Education  2020 Elsevier Inc.  

## 2019-10-26 NOTE — Progress Notes (Signed)
Chief Complaint  Patient presents with  . Follow-up  . Immunizations    high dose flu    F/u doing well  1. HTN had bacon and ham this am on toprol 25 mg xl and micardis 40-12.5 HLD lipitor 80 mg qhs  2.DM 2 on metformin 1000 bid and glipizide 2.5 qd eye exam utd  Review of Systems  Constitutional: Negative for weight loss.  HENT: Negative for hearing loss.   Eyes: Negative for blurred vision.  Respiratory: Negative for shortness of breath.   Cardiovascular: Negative for chest pain.  Gastrointestinal: Negative for abdominal pain, blood in stool and constipation.  Musculoskeletal: Negative for falls.  Skin: Negative for rash.  Psychiatric/Behavioral: Negative for depression.   Past Medical History:  Diagnosis Date  . Chicken pox   . Coronary artery disease   . Diabetes mellitus without complication ([place])   . Hyperlipidemia   . Hypertension   . Urine incontinence    Past Surgical History:  Procedure Laterality Date  . ABDOMINAL HYSTERECTOMY    . CARDIAC CATHETERIZATION  05/22/2000   ef 60%  . COLONOSCOPY WITH PROPOFOL N/A 07/14/2017   Procedure: COLONOSCOPY WITH PROPOFOL;  Surgeon: Jonathon Bellows, MD;  Location: [facility] ENDOSCOPY;  Service: Gastroenterology;  Laterality: N/A;  . CORONARY ARTERY BYPASS GRAFT     x3 vessel repair   . EYE SURGERY     [facility] [place]. Still has trouble with right eye vision though able to see peripheral vision   . US ECHOCARDIOGRAPHY  03/10/2007   EF 55-60%   Family History  Problem Relation Age of Onset  . Hypertension Mother   . Stroke Mother   . Diabetes Mother   . Heart attack Father   . Hypertension Father   . Heart attack Brother   . Breast cancer Neg Hx   . Cancer Neg Hx        FH lung cancer    Social History   Socioeconomic History  . Marital status: Single    Spouse name: Not on file  . Number of children: Not on file  . Years of education: Not on file  . Highest education level: Not on file   Occupational History  . Not on file  Tobacco Use  . Smoking status: Former Smoker    Quit date: 02/04/2000    Years since quitting: 19.7  . Smokeless tobacco: Never Used  . Tobacco comment: smoked 20 years quit 2002 max dose 1ppd   Vaping Use  . Vaping Use: Never used  Substance and Sexual Activity  . Alcohol use: Yes  . Drug use: No  . Sexual activity: Never  Other Topics Concern  . Not on file  Social History Narrative   Former smoker    Lives with daughter and kids and son in [employer]             Social Determinants of Health   Financial Resource Strain:   . Difficulty of Paying Living Expenses: Not on file  Food Insecurity:   . Worried About Charity fundraiser in the Last Year: Not on file  . Ran Out of Food in the Last Year: Not on file  Transportation Needs:   . Lack of Transportation (Medical): Not on file  . Lack of Transportation (Non-Medical): Not on file  Physical Activity:   . Days of Exercise per Week: Not on file  . Minutes of Exercise per Session: Not on file  Stress:   .  Feeling of Stress : Not on file  Social Connections:   . Frequency of Communication with Friends and Family: Not on file  . Frequency of Social Gatherings with Friends and Family: Not on file  . Attends Religious Services: Not on file  . Active Member of Clubs or Organizations: Not on file  . Attends Archivist Meetings: Not on file  . Marital Status: Not on file  Intimate Partner Violence:   . Fear of Current or Ex-Partner: Not on file  . Emotionally Abused: Not on file  . Physically Abused: Not on file  . Sexually Abused: Not on file   Current Meds  Medication Sig  . aspirin 81 MG tablet Take 81 mg by mouth daily.  Marland Kitchen atorvastatin (LIPITOR) 80 MG tablet Take 1 tablet (80 mg total) by mouth daily at 6 PM.  . Cholecalciferol 50 MCG (2000 UT) TABS Take 1 tablet by mouth daily.  Marland Kitchen glipiZIDE (GLUCOTROL XL) 2.5 MG 24 hr tablet Take 1 tablet (2.5 mg total) by mouth daily with  breakfast.  . metFORMIN (GLUCOPHAGE) 1000 MG tablet Take 1 tablet (1,000 mg total) by mouth 2 (two) times daily. With food  . metoprolol succinate (TOPROL-XL) 25 MG 24 hr tablet TAKE ONE TABLET BY MOUTH DAILY  . Multiple Vitamin (MULTIVITAMIN) capsule Take 1 capsule by mouth daily.  Marland Kitchen telmisartan-hydrochlorothiazide (MICARDIS HCT) 40-12.5 MG tablet Take 1 tablet by mouth daily.  . [DISCONTINUED] atorvastatin (LIPITOR) 80 MG tablet Take 1 tablet (80 mg total) by mouth daily at 6 PM.  . [DISCONTINUED] glipiZIDE (GLUCOTROL XL) 2.5 MG 24 hr tablet Take 1 tablet (2.5 mg total) by mouth daily with breakfast.  . [DISCONTINUED] metFORMIN (GLUCOPHAGE) 1000 MG tablet Take 1 tablet (1,000 mg total) by mouth 2 (two) times daily. With food  . [DISCONTINUED] metoprolol succinate (TOPROL-XL) 25 MG 24 hr tablet TAKE ONE TABLET BY MOUTH DAILY  . [DISCONTINUED] telmisartan-hydrochlorothiazide (MICARDIS HCT) 40-12.5 MG tablet Take 1 tablet by mouth daily.   No Known Allergies No results found for this or any previous visit (from the past 2160 hour(s)). Objective  Body mass index is 37.34 kg/m. Wt Readings from Last 3 Encounters:  10/26/19 224 lb 6.4 oz (101.8 kg)  01/13/19 216 lb (98 kg)  11/10/17 218 lb 3.2 oz (99 kg)   Temp Readings from Last 3 Encounters:  10/26/19 98.4 F (36.9 C) (Oral)  11/10/17 98.1 F (36.7 C) (Oral)  07/14/17 (!) 97 F (36.1 C)   BP Readings from Last 3 Encounters:  10/26/19 128/84  01/13/19 118/85  11/10/17 128/78   Pulse Readings from Last 3 Encounters:  10/26/19 95  01/13/19 79  11/10/17 86    Physical Exam Vitals and nursing note reviewed.  Constitutional:      Appearance: Normal appearance. She is well-developed and well-groomed. She is obese.  HENT:     Head: Normocephalic and atraumatic.  Eyes:     Conjunctiva/sclera: Conjunctivae normal.     Pupils: Pupils are equal, round, and reactive to light.  Cardiovascular:     Rate and Rhythm: Normal rate and  regular rhythm.     Heart sounds: Normal heart sounds. No murmur heard.   Pulmonary:     Effort: Pulmonary effort is normal.     Breath sounds: Normal breath sounds.  Abdominal:     General: Abdomen is flat. Bowel sounds are normal.     Tenderness: There is no abdominal tenderness.  Neurological:     General: No focal  deficit present.     Mental Status: She is alert and oriented to person, place, and time. Mental status is at baseline.     Gait: Gait normal.  Psychiatric:        Attention and Perception: Attention and perception normal.        Mood and Affect: Mood and affect normal.        Speech: Speech normal.        Behavior: Behavior normal. Behavior is cooperative.        Thought Content: Thought content normal.        Cognition and Memory: Cognition and memory normal.        Judgment: Judgment normal.     Assessment  Plan  Hypertension associated with diabetes ([place]) - Plan: Comprehensive metabolic panel, Lipid panel, CBC with Differential/Platelet, Urinalysis, Routine w reflex microscopic, Hemoglobin A1c, Microalbumin / creatinine urine ratio  Needs flu shot - Plan: Flu Vaccine QUAD High Dose(Fluad)  Essential hypertension - Plan: telmisartan-hydrochlorothiazide (MICARDIS HCT) 40-12.5 MG tablet, metoprolol succinate (TOPROL-XL) 25 MG 24 hr tablet, Comprehensive metabolic panel, Lipid panel, CBC with Differential/Platelet  Type 2 diabetes mellitus with hyperglycemia, without long-term current use of insulin (HCC) - Plan: metFORMIN (GLUCOPHAGE) 1000 MG tablet, glipiZIDE (GLUCOTROL XL) 2.5 MG 24 hr tablet, Hemoglobin A1c, Microalbumin / creatinine urine ratio  Hyperlipidemia, unspecified hyperlipidemia type - Plan: atorvastatin (LIPITOR) 80 MG tablet  Thyroid disorder screening - Plan: TSH  Vitamin D deficiency - Plan: Vitamin D (25 hydroxy)   HM Flu shotutdhigh dose given today Pfizer 2nd dose 08/02/19 consider booster  Prevnar11/20/18  pna 23 utd Pt needs Tdap in  future and to disc shingrix -given Rxprev.and today 10/26/19  Will rec hep b vaccine in future  Hep C neg MMR immune  02/22/18 mammogram negative ordered  03/11/19 negative repeat   No need for pap s/p hysterectomy  Colonoscopy had 07/2017 negative f/u in 10 years  1/20/20DEXAnormal low dose Ct screening for lung cancer in future -does not qualify CT chest  -smoker x 25 years quit in 2001 max 1ppd no FH lung cancer  Provider: Dr. Olivia Mackie McLean-Scocuzza-Internal Medicine

## 2019-10-31 ENCOUNTER — Other Ambulatory Visit: Payer: Self-pay

## 2019-10-31 ENCOUNTER — Other Ambulatory Visit (INDEPENDENT_AMBULATORY_CARE_PROVIDER_SITE_OTHER): Payer: Medicare Other

## 2019-10-31 DIAGNOSIS — Z1329 Encounter for screening for other suspected endocrine disorder: Secondary | ICD-10-CM

## 2019-10-31 DIAGNOSIS — E1159 Type 2 diabetes mellitus with other circulatory complications: Secondary | ICD-10-CM | POA: Diagnosis not present

## 2019-10-31 DIAGNOSIS — I10 Essential (primary) hypertension: Secondary | ICD-10-CM | POA: Diagnosis not present

## 2019-10-31 DIAGNOSIS — E559 Vitamin D deficiency, unspecified: Secondary | ICD-10-CM

## 2019-10-31 DIAGNOSIS — E1165 Type 2 diabetes mellitus with hyperglycemia: Secondary | ICD-10-CM | POA: Diagnosis not present

## 2019-10-31 LAB — LIPID PANEL
Cholesterol: 133 mg/dL (ref 0–200)
HDL: 33 mg/dL — ABNORMAL LOW (ref >39.00)
LDL Cholesterol: 83 mg/dL (ref 0–99)
NonHDL: 100.37
Total CHOL/HDL Ratio: 4
Triglycerides: 87 mg/dL (ref 0.0–149.0)
VLDL: 17.4 mg/dL (ref 0.0–40.0)

## 2019-10-31 LAB — CBC WITH DIFFERENTIAL/PLATELET
Basophils Absolute: 0.1 10*3/uL (ref 0.0–0.1)
Basophils Relative: 0.9 % (ref 0.0–3.0)
Eosinophils Absolute: 0.1 10*3/uL (ref 0.0–0.7)
Eosinophils Relative: 1.7 % (ref 0.0–5.0)
HCT: 35.8 % — ABNORMAL LOW (ref 36.0–46.0)
Hemoglobin: 11.6 g/dL — ABNORMAL LOW (ref 12.0–15.0)
Lymphocytes Relative: 38.6 % (ref 12.0–46.0)
Lymphs Abs: 2.4 10*3/uL (ref 0.7–4.0)
MCHC: 32.4 g/dL (ref 30.0–36.0)
MCV: 92.2 fl (ref 78.0–100.0)
Monocytes Absolute: 0.5 10*3/uL (ref 0.1–1.0)
Monocytes Relative: 7.7 % (ref 3.0–12.0)
Neutro Abs: 3.2 10*3/uL (ref 1.4–7.7)
Neutrophils Relative %: 51.1 % (ref 43.0–77.0)
Platelets: 297 10*3/uL (ref 150.0–400.0)
RBC: 3.89 Mil/uL (ref 3.87–5.11)
RDW: 14.3 % (ref 11.5–15.5)
WBC: 6.3 10*3/uL (ref 4.0–10.5)

## 2019-10-31 LAB — HEMOGLOBIN A1C: Hgb A1c MFr Bld: 6.8 % — ABNORMAL HIGH (ref 4.6–6.5)

## 2019-10-31 LAB — COMPREHENSIVE METABOLIC PANEL
ALT: 15 U/L (ref 0–35)
AST: 14 U/L (ref 0–37)
Albumin: 4 g/dL (ref 3.5–5.2)
Alkaline Phosphatase: 81 U/L (ref 39–117)
BUN: 20 mg/dL (ref 6–23)
CO2: 27 mEq/L (ref 19–32)
Calcium: 9.9 mg/dL (ref 8.4–10.5)
Chloride: 103 mEq/L (ref 96–112)
Creatinine, Ser: 1.24 mg/dL — ABNORMAL HIGH (ref 0.40–1.20)
GFR: 51.21 mL/min — ABNORMAL LOW (ref >60.00)
Glucose, Bld: 100 mg/dL — ABNORMAL HIGH (ref 70–99)
Potassium: 4.3 mEq/L (ref 3.5–5.1)
Sodium: 140 mEq/L (ref 135–145)
Total Bilirubin: 0.5 mg/dL (ref 0.2–1.2)
Total Protein: 6.8 g/dL (ref 6.0–8.3)

## 2019-10-31 LAB — VITAMIN D 25 HYDROXY (VIT D DEFICIENCY, FRACTURES): VITD: 41.21 ng/mL (ref 30.00–100.00)

## 2019-10-31 LAB — TSH: TSH: 2.56 u[IU]/mL (ref 0.35–4.50)

## 2019-11-01 LAB — URINALYSIS, ROUTINE W REFLEX MICROSCOPIC
Bacteria, UA: NONE SEEN /HPF
Bilirubin Urine: NEGATIVE
Glucose, UA: NEGATIVE
Hgb urine dipstick: NEGATIVE
Hyaline Cast: NONE SEEN /LPF
Ketones, ur: NEGATIVE
Nitrite: NEGATIVE
Protein, ur: NEGATIVE
RBC / HPF: NONE SEEN /HPF (ref 0–2)
Specific Gravity, Urine: 1.017 (ref 1.001–1.03)
pH: <=5 (ref 5.0–8.0)

## 2019-11-01 LAB — MICROALBUMIN / CREATININE URINE RATIO
Creatinine, Urine: 157 mg/dL (ref 20–275)
Microalb Creat Ratio: 3 mcg/mg creat (ref <30)
Microalb, Ur: 0.4 mg/dL

## 2020-01-12 DIAGNOSIS — Z23 Encounter for immunization: Secondary | ICD-10-CM | POA: Diagnosis not present

## 2020-02-27 ENCOUNTER — Other Ambulatory Visit: Payer: Self-pay | Admitting: Internal Medicine

## 2020-02-27 DIAGNOSIS — Z1231 Encounter for screening mammogram for malignant neoplasm of breast: Secondary | ICD-10-CM

## 2020-03-09 ENCOUNTER — Other Ambulatory Visit: Payer: Self-pay

## 2020-03-09 ENCOUNTER — Ambulatory Visit
Admission: RE | Admit: 2020-03-09 | Discharge: 2020-03-09 | Disposition: A | Discharge status: Home / Self Care | Payer: Medicare Other | Source: Ambulatory Visit | Attending: Internal Medicine | Admitting: Internal Medicine

## 2020-03-09 DIAGNOSIS — Z1231 Encounter for screening mammogram for malignant neoplasm of breast: Secondary | ICD-10-CM | POA: Insufficient documentation

## 2020-04-26 ENCOUNTER — Other Ambulatory Visit: Payer: Self-pay

## 2020-04-26 ENCOUNTER — Encounter: Payer: Self-pay | Admitting: Internal Medicine

## 2020-04-26 ENCOUNTER — Ambulatory Visit (INDEPENDENT_AMBULATORY_CARE_PROVIDER_SITE_OTHER): Payer: Medicare Other | Admitting: Internal Medicine

## 2020-04-26 VITALS — BP 144/96 | HR 95 | Temp 97.7°F | Ht 65.0 in | Wt 223.2 lb

## 2020-04-26 DIAGNOSIS — I10 Essential (primary) hypertension: Secondary | ICD-10-CM

## 2020-04-26 DIAGNOSIS — E1165 Type 2 diabetes mellitus with hyperglycemia: Secondary | ICD-10-CM | POA: Diagnosis not present

## 2020-04-26 DIAGNOSIS — E611 Iron deficiency: Secondary | ICD-10-CM | POA: Diagnosis not present

## 2020-04-26 DIAGNOSIS — I152 Hypertension secondary to endocrine disorders: Secondary | ICD-10-CM | POA: Diagnosis not present

## 2020-04-26 DIAGNOSIS — Z1231 Encounter for screening mammogram for malignant neoplasm of breast: Secondary | ICD-10-CM | POA: Diagnosis not present

## 2020-04-26 DIAGNOSIS — E1159 Type 2 diabetes mellitus with other circulatory complications: Secondary | ICD-10-CM | POA: Diagnosis not present

## 2020-04-26 DIAGNOSIS — E785 Hyperlipidemia, unspecified: Secondary | ICD-10-CM

## 2020-04-26 LAB — CBC WITH DIFFERENTIAL/PLATELET
Basophils Absolute: 0 10*3/uL (ref 0.0–0.1)
Basophils Relative: 0.6 % (ref 0.0–3.0)
Eosinophils Absolute: 0.2 10*3/uL (ref 0.0–0.7)
Eosinophils Relative: 2.7 % (ref 0.0–5.0)
HCT: 36.5 % (ref 36.0–46.0)
Hemoglobin: 12 g/dL (ref 12.0–15.0)
Lymphocytes Relative: 32.2 % (ref 12.0–46.0)
Lymphs Abs: 2 10*3/uL (ref 0.7–4.0)
MCHC: 32.9 g/dL (ref 30.0–36.0)
MCV: 91.8 fl (ref 78.0–100.0)
Monocytes Absolute: 0.6 10*3/uL (ref 0.1–1.0)
Monocytes Relative: 9.2 % (ref 3.0–12.0)
Neutro Abs: 3.5 10*3/uL (ref 1.4–7.7)
Neutrophils Relative %: 55.3 % (ref 43.0–77.0)
Platelets: 312 10*3/uL (ref 150.0–400.0)
RBC: 3.97 Mil/uL (ref 3.87–5.11)
RDW: 15 % (ref 11.5–15.5)
WBC: 6.3 10*3/uL (ref 4.0–10.5)

## 2020-04-26 LAB — COMPREHENSIVE METABOLIC PANEL
ALT: 14 U/L (ref 0–35)
AST: 13 U/L (ref 0–37)
Albumin: 4.3 g/dL (ref 3.5–5.2)
Alkaline Phosphatase: 88 U/L (ref 39–117)
BUN: 23 mg/dL (ref 6–23)
CO2: 27 mEq/L (ref 19–32)
Calcium: 9.7 mg/dL (ref 8.4–10.5)
Chloride: 104 mEq/L (ref 96–112)
Creatinine, Ser: 1.21 mg/dL — ABNORMAL HIGH (ref 0.40–1.20)
GFR: 44.25 mL/min — ABNORMAL LOW (ref >60.00)
Glucose, Bld: 138 mg/dL — ABNORMAL HIGH (ref 70–99)
Potassium: 4.8 mEq/L (ref 3.5–5.1)
Sodium: 140 mEq/L (ref 135–145)
Total Bilirubin: 0.3 mg/dL (ref 0.2–1.2)
Total Protein: 7.1 g/dL (ref 6.0–8.3)

## 2020-04-26 LAB — LIPID PANEL
Cholesterol: 151 mg/dL (ref 0–200)
HDL: 38.5 mg/dL — ABNORMAL LOW (ref >39.00)
LDL Cholesterol: 85 mg/dL (ref 0–99)
NonHDL: 112.06
Total CHOL/HDL Ratio: 4
Triglycerides: 136 mg/dL (ref 0.0–149.0)
VLDL: 27.2 mg/dL (ref 0.0–40.0)

## 2020-04-26 LAB — HEMOGLOBIN A1C: Hgb A1c MFr Bld: 6.7 % — ABNORMAL HIGH (ref 4.6–6.5)

## 2020-04-26 MED ORDER — TELMISARTAN-HCTZ 40-12.5 MG PO TABS: 1.0000 | ORAL_TABLET | Freq: Every day | ORAL | 3 refills | Status: DC

## 2020-04-26 MED ORDER — ATORVASTATIN CALCIUM 80 MG PO TABS: 80.0000 mg | ORAL_TABLET | Freq: Every day | ORAL | 3 refills | Status: DC

## 2020-04-26 MED ORDER — GLIPIZIDE ER 2.5 MG PO TB24: 2.5000 mg | ORAL_TABLET | Freq: Every day | ORAL | 3 refills | Status: DC

## 2020-04-26 MED ORDER — METFORMIN HCL 1000 MG PO TABS: 1000.0000 mg | ORAL_TABLET | Freq: Two times a day (BID) | ORAL | 3 refills | Status: DC

## 2020-04-26 MED ORDER — METOPROLOL SUCCINATE ER 25 MG PO TB24: ORAL_TABLET | ORAL | 3 refills | Status: DC

## 2020-04-26 NOTE — Patient Instructions (Addendum)
Consider 4th covid shot pfizer when available  Tdap vaccine  shingrix vaccine   Consider [facility] in the future to follow up since your bypass   Eye exam due now last 04/13/19-call to schedule please   Zoster Vaccine, Recombinant injection What is this medicine? ZOSTER VACCINE (ZOS ter vak SEEN) is a vaccine used to reduce the risk of getting shingles. This vaccine is not used to treat shingles or nerve pain from shingles. This medicine may be used for other purposes; ask your health care provider or pharmacist if you have questions. COMMON BRAND NAME(S): [facility] What should I tell my health care provider before I take this medicine? They need to know if you have any of these conditions:  cancer  immune system problems  an unusual or allergic reaction to Zoster vaccine, other medications, foods, dyes, or preservatives  pregnant or trying to get pregnant  breast-feeding How should I use this medicine? This vaccine is injected into a muscle. It is given by a health care provider. A copy of Vaccine Information Statements will be given before each vaccination. Be sure to read this information carefully each time. This sheet may change often. Talk to your health care provider about the use of this vaccine in children. This vaccine is not approved for use in children. Overdosage: If you think you have taken too much of this medicine contact a poison control center or emergency room at once. NOTE: This medicine is only for you. Do not share this medicine with others. What if I miss a dose? Keep appointments for follow-up (booster) doses. It is important not to miss your dose. Call your health care provider if you are unable to keep an appointment. What may interact with this medicine?  medicines that suppress your immune system  medicines to treat cancer  steroid medicines like prednisone or cortisone This list may not describe all possible interactions. Give your health  care provider a list of all the medicines, herbs, non-prescription drugs, or dietary supplements you use. Also tell them if you smoke, drink alcohol, or use illegal drugs. Some items may interact with your medicine. What should I watch for while using this medicine? Visit your health care provider regularly. This vaccine, like all vaccines, may not fully protect everyone. What side effects may I notice from receiving this medicine? Side effects that you should report to your doctor or health care professional as soon as possible:  allergic reactions (skin rash, itching or hives; swelling of the face, lips, or tongue)  trouble breathing Side effects that usually do not require medical attention (report these to your doctor or health care professional if they continue or are bothersome):  chills  headache  fever  nausea  pain, redness, or irritation at site where injected  tiredness  vomiting This list may not describe all possible side effects. Call your doctor for medical advice about side effects. You may report side effects to FDA at 1-800-FDA-1088. Where should I keep my medicine? This vaccine is only given by a health care provider. It will not be stored at home. NOTE: This sheet is a summary. It may not cover all possible information. If you have questions about this medicine, talk to your doctor, pharmacist, or health care provider.  2021 Elsevier/Gold Standard (2019-02-25 16:23:07)   Tdap (Tetanus, Diphtheria, Pertussis) Vaccine: What You Need to Know 1. Why get vaccinated? Tdap vaccine can prevent tetanus, diphtheria, and pertussis. Diphtheria and pertussis spread from person to person. Tetanus enters  the body through cuts or wounds.  TETANUS (T) causes painful stiffening of the muscles. Tetanus can lead to serious health problems, including being unable to open the mouth, having trouble swallowing and breathing, or death.  DIPHTHERIA (D) can lead to difficulty  breathing, heart failure, paralysis, or death.  PERTUSSIS (aP), also known as "whooping cough," can cause uncontrollable, violent coughing that makes it hard to breathe, eat, or drink. Pertussis can be extremely serious especially in babies and young children, causing pneumonia, convulsions, brain damage, or death. In teens and adults, it can cause weight loss, loss of bladder control, passing out, and rib fractures from severe coughing. 2. Tdap vaccine Tdap is only for children 7 years and older, adolescents, and adults.  Adolescents should receive a single dose of Tdap, preferably at age 79 or 12 years. Pregnant people should get a dose of Tdap during every pregnancy, preferably during the early part of the third trimester, to help protect the newborn from pertussis. Infants are most at risk for severe, life-threatening complications from pertussis. Adults who have never received Tdap should get a dose of Tdap. Also, adults should receive a booster dose of either Tdap or Td (a different vaccine that protects against tetanus and diphtheria but not pertussis) every 10 years, or after 5 years in the case of a severe or dirty wound or burn. Tdap may be given at the same time as other vaccines. 3. Talk with your health care provider Tell your vaccine provider if the person getting the vaccine:  Has had an allergic reaction after a previous dose of any vaccine that protects against tetanus, diphtheria, or pertussis, or has any severe, life-threatening allergies  Has had a coma, decreased level of consciousness, or prolonged seizures within 7 days after a previous dose of any pertussis vaccine (DTP, DTaP, or Tdap)  Has seizures or another nervous system problem  Has ever had Guillain-Barr Syndrome (also called "GBS")  Has had severe pain or swelling after a previous dose of any vaccine that protects against tetanus or diphtheria In some cases, your health care provider may decide to postpone Tdap  vaccination until a future visit. People with minor illnesses, such as a cold, may be vaccinated. People who are moderately or severely ill should usually wait until they recover before getting Tdap vaccine.  Your health care provider can give you more information. 4. Risks of a vaccine reaction  Pain, redness, or swelling where the shot was given, mild fever, headache, feeling tired, and nausea, vomiting, diarrhea, or stomachache sometimes happen after Tdap vaccination. People sometimes faint after medical procedures, including vaccination. Tell your provider if you feel dizzy or have vision changes or ringing in the ears.  As with any medicine, there is a very remote chance of a vaccine causing a severe allergic reaction, other serious injury, or death. 5. What if there is a serious problem? An allergic reaction could occur after the vaccinated person leaves the clinic. If you see signs of a severe allergic reaction (hives, swelling of the face and throat, difficulty breathing, a fast heartbeat, dizziness, or weakness), call 9-1-1 and get the person to the nearest hospital. For other signs that concern you, call your health care provider.  Adverse reactions should be reported to the Vaccine Adverse Event Reporting System (VAERS). Your health care provider will usually file this report, or you can do it yourself. Visit the VAERS website at www.vaers.LAgents.no or call (531)885-6069. VAERS is only for reporting reactions, and VAERS staff  members do not give medical advice. 6. The National Vaccine Injury Compensation Program The [employer] Vaccine Injury Compensation Program (VICP) is a federal program that was created to compensate people who may have been injured by certain vaccines. Claims regarding alleged injury or death due to vaccination have a time limit for filing, which may be as short as two years. Visit the VICP website at SpiritualWord.at or call 778-582-6523 to learn about the  program and about filing a claim. 7. How can I learn more?  Ask your health care provider.  Call your local or state health department.  Visit the website of the Food and Drug Administration (FDA) for vaccine package inserts and additional information at FinderList.no.  Contact the Centers for Disease Control and Prevention (CDC): ? Call 219 153 7298 (1-800-CDC-INFO) or ? Visit CDC's website at PicCapture.uy. Vaccine Information Statement Tdap (Tetanus, Diphtheria, Pertussis) Vaccine (09/09/2019) This information is not intended to replace advice given to you by your health care provider. Make sure you discuss any questions you have with your health care provider. Document Revised: 10/05/2019 Document Reviewed: 10/05/2019 Elsevier Patient Education  2021 [employer].

## 2020-04-26 NOTE — Progress Notes (Signed)
Chief Complaint  Patient presents with  . Follow-up   F/u  1. htn and DM 2 on glipizide 2.5 mg xl and metformin 1000 mcg qd, toprol 25 mg xl lipitor 80 mg qd micardis 40-12.5 mg qd Pt doing well today    Review of Systems  Constitutional: Negative for weight loss.  HENT: Negative for hearing loss.   Eyes: Negative for blurred vision.  Respiratory: Negative for shortness of breath.   Cardiovascular: Negative for chest pain.  Gastrointestinal: Negative for abdominal pain.  Musculoskeletal: Negative for falls and joint pain.  Skin: Negative for rash.  Neurological: Negative for headaches.  Psychiatric/Behavioral: Negative for depression.   Past Medical History:  Diagnosis Date  . Chicken pox   . Coronary artery disease   . Diabetes mellitus without complication ([place])   . Hyperlipidemia   . Hypertension   . Urine incontinence    Past Surgical History:  Procedure Laterality Date  . ABDOMINAL HYSTERECTOMY    . CARDIAC CATHETERIZATION  05/22/2000   ef 60%  . COLONOSCOPY WITH PROPOFOL N/A 07/14/2017   Procedure: COLONOSCOPY WITH PROPOFOL;  Surgeon: Jonathon Bellows, MD;  Location: [facility] ENDOSCOPY;  Service: Gastroenterology;  Laterality: N/A;  . CORONARY ARTERY BYPASS GRAFT     x3 vessel repair 2002  . EYE SURGERY     [facility] [place]. Still has trouble with right eye vision though able to see peripheral vision   . US ECHOCARDIOGRAPHY  03/10/2007   EF 55-60%   Family History  Problem Relation Age of Onset  . Hypertension Mother   . Stroke Mother   . Diabetes Mother   . Heart attack Father   . Hypertension Father   . Heart attack Brother   . Breast cancer Neg Hx   . Cancer Neg Hx        FH lung cancer    Social History   Socioeconomic History  . Marital status: Single    Spouse name: Not on file  . Number of children: Not on file  . Years of education: Not on file  . Highest education level: Not on file  Occupational History  . Not on  file  Tobacco Use  . Smoking status: Former Smoker    Quit date: 02/04/2000    Years since quitting: 20.2  . Smokeless tobacco: Never Used  . Tobacco comment: smoked 20 years quit 2002 max dose 1ppd   Vaping Use  . Vaping Use: Never used  Substance and Sexual Activity  . Alcohol use: Yes  . Drug use: No  . Sexual activity: Never  Other Topics Concern  . Not on file  Social History Narrative   Former smoker    Lives with daughter and kids and son in [employer]             Social Determinants of Health   Financial Resource Strain: Not on file  Food Insecurity: Not on file  Transportation Needs: Not on file  Physical Activity: Not on file  Stress: Not on file  Social Connections: Not on file  Intimate Partner Violence: Not on file   Current Meds  Medication Sig  . aspirin 81 MG tablet Take 81 mg by mouth daily.  . Cholecalciferol 50 MCG (2000 UT) TABS Take 1 tablet by mouth daily.  . Multiple Vitamin (MULTIVITAMIN) capsule Take 1 capsule by mouth daily.  . [DISCONTINUED] atorvastatin (LIPITOR) 80 MG tablet Take 1 tablet (80 mg total) by mouth daily at 6  PM.  . [DISCONTINUED] glipiZIDE (GLUCOTROL XL) 2.5 MG 24 hr tablet Take 1 tablet (2.5 mg total) by mouth daily with breakfast.  . [DISCONTINUED] metFORMIN (GLUCOPHAGE) 1000 MG tablet Take 1 tablet (1,000 mg total) by mouth 2 (two) times daily. With food  . [DISCONTINUED] metoprolol succinate (TOPROL-XL) 25 MG 24 hr tablet TAKE ONE TABLET BY MOUTH DAILY  . [DISCONTINUED] telmisartan-hydrochlorothiazide (MICARDIS HCT) 40-12.5 MG tablet Take 1 tablet by mouth daily.   No Known Allergies No results found for this or any previous visit (from the past 2160 hour(s)). Objective  Body mass index is 37.14 kg/m. Wt Readings from Last 3 Encounters:  04/26/20 223 lb 3.2 oz (101.2 kg)  10/26/19 224 lb 6.4 oz (101.8 kg)  01/13/19 216 lb (98 kg)   Temp Readings from Last 3 Encounters:  04/26/20 97.7 F (36.5 C) (Oral)  10/26/19 98.4 F  (36.9 C) (Oral)  11/10/17 98.1 F (36.7 C) (Oral)   BP Readings from Last 3 Encounters:  04/26/20 (!) 144/96  10/26/19 128/84  01/13/19 118/85   Pulse Readings from Last 3 Encounters:  04/26/20 95  10/26/19 95  01/13/19 79    Physical Exam Vitals and nursing note reviewed.  Constitutional:      Appearance: Normal appearance. She is well-developed and well-groomed. She is obese.  HENT:     Head: Normocephalic and atraumatic.  Eyes:     Conjunctiva/sclera: Conjunctivae normal.     Pupils: Pupils are equal, round, and reactive to light.  Cardiovascular:     Rate and Rhythm: Normal rate and regular rhythm.     Heart sounds: Normal heart sounds. No murmur heard.   Pulmonary:     Effort: Pulmonary effort is normal.     Breath sounds: Normal breath sounds.  Abdominal:     Tenderness: There is no abdominal tenderness.  Skin:    General: Skin is warm and dry.  Neurological:     General: No focal deficit present.     Mental Status: She is alert and oriented to person, place, and time. Mental status is at baseline.     Gait: Gait normal.  Psychiatric:        Attention and Perception: Attention and perception normal.        Mood and Affect: Mood and affect normal.        Speech: Speech normal.        Behavior: Behavior normal. Behavior is cooperative.        Thought Content: Thought content normal.        Cognition and Memory: Cognition normal.        Judgment: Judgment normal.     Assessment  Plan  Hypertension associated with diabetes ([place]) - Plan: Comprehensive metabolic panel, Lipid panel, Hemoglobin A1c, CBC w/Diff glipizide 2.5 mg xl and metformin 1000 mcg qd, toprol 25 mg xl lipitor 80 mg qd micardis 40-12.5 mg qd  S/p cabg in 2002, Hyperlipidemia, unspecified hyperlipidemia type - Plan: atorvastatin (LIPITOR) 80 MG tablet rec f/u cards its been since 2017   HM Flu shotutd Pfizer 2nd dose 08/02/19 consider booster  Prevnar11/20/18  pna 23 utd Pt needs  Tdap in future and to disc shingrix -given Rxprev.and today 10/26/19 Given Rx shingrix and Tdap   Will rec hep b vaccine in future  Hep C neg MMR immune  02/22/18 mammogram negativeordered 03/11/19 negative repeat 03/09/20 negative ordered 2023  No need for pap s/p hysterectomy  Colonoscopy had 07/2017 negative f/u in 10 years  1/20/20DEXAnormal  low dose Ct screening for lung cancer in future -does not qualify CT chest  -smoker x 25 years quit in 2001 max 1ppd no FH lung cancer   Provider: Dr. Olivia Mackie McLean-Scocuzza-Internal Medicine

## 2020-04-27 LAB — IRON,TIBC AND FERRITIN PANEL
Ferritin: 43 ng/mL (ref 15–150)
Iron Saturation: 16 % (ref 15–55)
Iron: 42 ug/dL (ref 27–139)
Total Iron Binding Capacity: 255 ug/dL (ref 250–450)
UIBC: 213 ug/dL (ref 118–369)

## 2020-07-09 DIAGNOSIS — Z23 Encounter for immunization: Secondary | ICD-10-CM | POA: Diagnosis not present

## 2020-07-26 ENCOUNTER — Telehealth: Payer: Self-pay | Admitting: Internal Medicine

## 2020-07-26 NOTE — Telephone Encounter (Signed)
I spoke to patient and she said she would call back and schedule Medicare Annual Wellness Visit (AWV) in office.   If not able to come in office, please offer to do virtually or by telephone.   Due for AWVI  Please schedule at anytime with Nurse Health Advisor.

## 2020-11-30 ENCOUNTER — Other Ambulatory Visit: Payer: Self-pay

## 2020-11-30 ENCOUNTER — Ambulatory Visit (INDEPENDENT_AMBULATORY_CARE_PROVIDER_SITE_OTHER): Payer: Medicare Other | Admitting: Internal Medicine

## 2020-11-30 ENCOUNTER — Encounter: Payer: Self-pay | Admitting: Internal Medicine

## 2020-11-30 VITALS — BP 136/84 | HR 101 | Temp 98.7°F | Ht 65.0 in | Wt 225.6 lb

## 2020-11-30 DIAGNOSIS — Z1231 Encounter for screening mammogram for malignant neoplasm of breast: Secondary | ICD-10-CM | POA: Diagnosis not present

## 2020-11-30 DIAGNOSIS — Z1329 Encounter for screening for other suspected endocrine disorder: Secondary | ICD-10-CM | POA: Diagnosis not present

## 2020-11-30 DIAGNOSIS — E559 Vitamin D deficiency, unspecified: Secondary | ICD-10-CM | POA: Diagnosis not present

## 2020-11-30 DIAGNOSIS — Z23 Encounter for immunization: Secondary | ICD-10-CM

## 2020-11-30 DIAGNOSIS — I152 Hypertension secondary to endocrine disorders: Secondary | ICD-10-CM | POA: Diagnosis not present

## 2020-11-30 DIAGNOSIS — I10 Essential (primary) hypertension: Secondary | ICD-10-CM | POA: Diagnosis not present

## 2020-11-30 DIAGNOSIS — E1159 Type 2 diabetes mellitus with other circulatory complications: Secondary | ICD-10-CM | POA: Diagnosis not present

## 2020-11-30 DIAGNOSIS — E1165 Type 2 diabetes mellitus with hyperglycemia: Secondary | ICD-10-CM

## 2020-11-30 DIAGNOSIS — E785 Hyperlipidemia, unspecified: Secondary | ICD-10-CM | POA: Diagnosis not present

## 2020-11-30 LAB — COMPREHENSIVE METABOLIC PANEL
ALT: 14 U/L (ref 0–35)
AST: 14 U/L (ref 0–37)
Albumin: 4.2 g/dL (ref 3.5–5.2)
Alkaline Phosphatase: 85 U/L (ref 39–117)
BUN: 22 mg/dL (ref 6–23)
CO2: 28 mEq/L (ref 19–32)
Calcium: 9.8 mg/dL (ref 8.4–10.5)
Chloride: 102 mEq/L (ref 96–112)
Creatinine, Ser: 1.27 mg/dL — ABNORMAL HIGH (ref 0.40–1.20)
GFR: 41.58 mL/min — ABNORMAL LOW (ref >60.00)
Glucose, Bld: 138 mg/dL — ABNORMAL HIGH (ref 70–99)
Potassium: 4.5 mEq/L (ref 3.5–5.1)
Sodium: 140 mEq/L (ref 135–145)
Total Bilirubin: 0.5 mg/dL (ref 0.2–1.2)
Total Protein: 7 g/dL (ref 6.0–8.3)

## 2020-11-30 LAB — LIPID PANEL
Cholesterol: 150 mg/dL (ref 0–200)
HDL: 34.5 mg/dL — ABNORMAL LOW (ref >39.00)
LDL Cholesterol: 83 mg/dL (ref 0–99)
NonHDL: 115.93
Total CHOL/HDL Ratio: 4
Triglycerides: 163 mg/dL — ABNORMAL HIGH (ref 0.0–149.0)
VLDL: 32.6 mg/dL (ref 0.0–40.0)

## 2020-11-30 LAB — CBC WITH DIFFERENTIAL/PLATELET
Basophils Absolute: 0 10*3/uL (ref 0.0–0.1)
Basophils Relative: 0.6 % (ref 0.0–3.0)
Eosinophils Absolute: 0.2 10*3/uL (ref 0.0–0.7)
Eosinophils Relative: 2.7 % (ref 0.0–5.0)
HCT: 36.8 % (ref 36.0–46.0)
Hemoglobin: 12 g/dL (ref 12.0–15.0)
Lymphocytes Relative: 34.5 % (ref 12.0–46.0)
Lymphs Abs: 2 10*3/uL (ref 0.7–4.0)
MCHC: 32.7 g/dL (ref 30.0–36.0)
MCV: 93.5 fl (ref 78.0–100.0)
Monocytes Absolute: 0.5 10*3/uL (ref 0.1–1.0)
Monocytes Relative: 8 % (ref 3.0–12.0)
Neutro Abs: 3.1 10*3/uL (ref 1.4–7.7)
Neutrophils Relative %: 54.2 % (ref 43.0–77.0)
Platelets: 302 10*3/uL (ref 150.0–400.0)
RBC: 3.94 Mil/uL (ref 3.87–5.11)
RDW: 15.2 % (ref 11.5–15.5)
WBC: 5.8 10*3/uL (ref 4.0–10.5)

## 2020-11-30 LAB — VITAMIN D 25 HYDROXY (VIT D DEFICIENCY, FRACTURES): VITD: 35.28 ng/mL (ref 30.00–100.00)

## 2020-11-30 LAB — TSH: TSH: 4.14 u[IU]/mL (ref 0.35–5.50)

## 2020-11-30 LAB — HEMOGLOBIN A1C: Hgb A1c MFr Bld: 6.9 % — ABNORMAL HIGH (ref 4.6–6.5)

## 2020-11-30 MED ORDER — TELMISARTAN-HCTZ 40-12.5 MG PO TABS: 1.0000 | ORAL_TABLET | Freq: Every day | ORAL | 3 refills | Status: DC

## 2020-11-30 MED ORDER — ATORVASTATIN CALCIUM 80 MG PO TABS: 80.0000 mg | ORAL_TABLET | Freq: Every day | ORAL | 3 refills | Status: DC

## 2020-11-30 MED ORDER — METOPROLOL SUCCINATE ER 50 MG PO TB24: ORAL_TABLET | ORAL | 3 refills | Status: DC

## 2020-11-30 MED ORDER — GLIPIZIDE ER 2.5 MG PO TB24: 2.5000 mg | ORAL_TABLET | Freq: Every day | ORAL | 3 refills | Status: DC

## 2020-11-30 MED ORDER — METFORMIN HCL 1000 MG PO TABS: 1000.0000 mg | ORAL_TABLET | Freq: Two times a day (BID) | ORAL | 3 refills | Status: DC

## 2020-11-30 NOTE — Progress Notes (Signed)
Chief Complaint  Patient presents with   Follow-up   F/u  1. Htn sl elevated today on toprol xl 25 mg qd micardis 40-12.5 mg qd , hld 80 mg qhs  2. Dm 2 glipizide 2.5 xl metformin 1000 mg bid    Review of Systems  Constitutional:  Negative for weight loss.  HENT:  Negative for hearing loss.   Eyes:  Negative for blurred vision.  Respiratory:  Negative for shortness of breath.   Cardiovascular:  Negative for chest pain.  Gastrointestinal:  Negative for abdominal pain.  Musculoskeletal:  Negative for falls and joint pain.  Skin:  Negative for rash.  Neurological:  Negative for headaches.  Psychiatric/Behavioral:  Negative for depression.   Past Medical History:  Diagnosis Date   Chicken pox    Coronary artery disease    Diabetes mellitus without complication ([place])    Hyperlipidemia    Hypertension    Urine incontinence    Past Surgical History:  Procedure Laterality Date   ABDOMINAL HYSTERECTOMY     CARDIAC CATHETERIZATION  05/22/2000   ef 60%   COLONOSCOPY WITH PROPOFOL N/A 07/14/2017   Procedure: COLONOSCOPY WITH PROPOFOL;  Surgeon: Jonathon Bellows, MD;  Location: [facility] ENDOSCOPY;  Service: Gastroenterology;  Laterality: N/A;   CORONARY ARTERY BYPASS GRAFT     x3 vessel repair 2002   EYE SURGERY     detached [facility] follows [facility] in [place]. Still has trouble with right eye vision though able to see peripheral vision    US ECHOCARDIOGRAPHY  03/10/2007   EF 55-60%   Family History  Problem Relation Age of Onset   Hypertension Mother    Stroke Mother    Diabetes Mother    Heart attack Father    Hypertension Father    Heart attack Brother    Breast cancer Neg Hx    Cancer Neg Hx        FH lung cancer    Social History   Socioeconomic History   Marital status: Single    Spouse name: Not on file   Number of children: Not on file   Years of education: Not on file   Highest education level: Not on file  Occupational History   Not on file  Tobacco Use    Smoking status: Former    Types: Cigarettes    Quit date: 02/04/2000    Years since quitting: 20.8   Smokeless tobacco: Never   Tobacco comments:    smoked 20 years quit 2002 max dose 1ppd   Vaping Use   Vaping Use: Never used  Substance and Sexual Activity   Alcohol use: Yes   Drug use: No   Sexual activity: Never  Other Topics Concern   Not on file  Social History Narrative   Former smoker    Lives with daughter and kids and son in [place]             Social Determinants of Health   Financial Resource Strain: Not on file  Food Insecurity: Not on file  Transportation Needs: Not on file  Physical Activity: Not on file  Stress: Not on file  Social Connections: Not on file  Intimate Partner Violence: Not on file   Current Meds  Medication Sig   aspirin 81 MG tablet Take 81 mg by mouth daily.   Cholecalciferol 50 MCG (2000 UT) TABS Take 1 tablet by mouth daily.   Multiple Vitamin (MULTIVITAMIN) capsule Take 1 capsule by mouth daily.   [DISCONTINUED]  atorvastatin (LIPITOR) 80 MG tablet Take 1 tablet (80 mg total) by mouth daily at 6 PM.   [DISCONTINUED] glipiZIDE (GLUCOTROL XL) 2.5 MG 24 hr tablet Take 1 tablet (2.5 mg total) by mouth daily with breakfast.   [DISCONTINUED] metFORMIN (GLUCOPHAGE) 1000 MG tablet Take 1 tablet (1,000 mg total) by mouth 2 (two) times daily. With food   [DISCONTINUED] metoprolol succinate (TOPROL-XL) 25 MG 24 hr tablet TAKE ONE TABLET BY MOUTH DAILY   [DISCONTINUED] telmisartan-hydrochlorothiazide (MICARDIS HCT) 40-12.5 MG tablet Take 1 tablet by mouth daily.   No Known Allergies No results found for this or any previous visit (from the past 2160 hour(s)). Objective  Body mass index is 37.54 kg/m. Wt Readings from Last 3 Encounters:  11/30/20 225 lb 9.6 oz (102.3 kg)  04/26/20 223 lb 3.2 oz (101.2 kg)  10/26/19 224 lb 6.4 oz (101.8 kg)   Temp Readings from Last 3 Encounters:  11/30/20 98.7 F (37.1 C) (Oral)  04/26/20 97.7 F (36.5 C) (Oral)   10/26/19 98.4 F (36.9 C) (Oral)   BP Readings from Last 3 Encounters:  11/30/20 136/84  04/26/20 (!) 144/96  10/26/19 128/84   Pulse Readings from Last 3 Encounters:  11/30/20 (!) 101  04/26/20 95  10/26/19 95    Physical Exam Vitals and nursing note reviewed.  Constitutional:      Appearance: Normal appearance. She is well-developed and well-groomed. She is obese.  HENT:     Head: Normocephalic and atraumatic.  Eyes:     Conjunctiva/sclera: Conjunctivae normal.     Pupils: Pupils are equal, round, and reactive to light.  Cardiovascular:     Rate and Rhythm: Normal rate and regular rhythm.     Heart sounds: Normal heart sounds. No murmur heard. Pulmonary:     Effort: Pulmonary effort is normal.     Breath sounds: Normal breath sounds.  Abdominal:     Tenderness: There is no abdominal tenderness.  Skin:    General: Skin is warm and dry.  Neurological:     General: No focal deficit present.     Mental Status: She is alert and oriented to person, place, and time. Mental status is at baseline.     Gait: Gait normal.  Psychiatric:        Attention and Perception: Attention and perception normal.        Mood and Affect: Mood and affect normal.        Speech: Speech normal.        Behavior: Behavior normal. Behavior is cooperative.        Thought Content: Thought content normal.        Cognition and Memory: Cognition and memory normal.        Judgment: Judgment normal.    Assessment  Plan  Hypertension associated with diabetes ([place]) - Plan: Comprehensive metabolic panel, Lipid panel, CBC with Differential/Platelet, Hemoglobin A1c, Microalbumin / creatinine urine ratio Lipitor 80 mg qd, glipizide 2.5 mg qd toprol xl 25 increase to 50 mg qd, metformin 1000 mg bid    Essential hypertension - Plan: metoprolol succinate (TOPROL-XL) 50 MG 24 hr tablet, telmisartan-hydrochlorothiazide (MICARDIS HCT) 40-12.5 MG tablet Type 2 diabetes mellitus with hyperglycemia, without  long-term current use of insulin (HCC) - Plan: metFORMIN (GLUCOPHAGE) 1000 MG tablet, glipiZIDE (GLUCOTROL XL) 2.5 MG 24 hr tablet  HM Flu shot utd  Pfizer 2nd dose 08/02/19 consider booster  Prevnar 12/23/16  pna 23 utd Tdap Rx today  Shingrix 2/2 had [facility]  Will rec hep b vaccine in future  Hep C neg  MMR immune    02/22/18 mammogram negative ordered  03/11/19 negative repeat 03/09/20 negative ordered 2023   No need for pap s/p hysterectomy    Colonoscopy had 07/2017 negative f/u in 10 years    02/22/18 DEXA normal   -does not qualify CT chest  -smoker x 25 years quit in 2001 max 1ppd no FH lung cancer    Rec healthy diet and exercise   Provider: Dr. Olivia Mackie McLean-Scocuzza-Internal Medicine

## 2020-11-30 NOTE — Patient Instructions (Addendum)
Tdap vaccine consider at the pharmacy  4th covid dose at pharmacy they are rec total 5 now covid shots   Goal blood pressure <130/<80 if elevated increase metoprolol 50 mg daily

## 2020-12-01 LAB — MICROALBUMIN / CREATININE URINE RATIO
Creatinine, Urine: 226 mg/dL (ref 20–275)
Microalb Creat Ratio: 3 mcg/mg creat (ref <30)
Microalb, Ur: 0.6 mg/dL

## 2020-12-10 ENCOUNTER — Other Ambulatory Visit: Payer: Self-pay | Admitting: Internal Medicine

## 2020-12-10 ENCOUNTER — Encounter: Payer: Self-pay | Admitting: Internal Medicine

## 2020-12-10 DIAGNOSIS — E1165 Type 2 diabetes mellitus with hyperglycemia: Secondary | ICD-10-CM

## 2020-12-10 DIAGNOSIS — N183 Chronic kidney disease, stage 3 unspecified: Secondary | ICD-10-CM | POA: Insufficient documentation

## 2020-12-10 MED ORDER — METFORMIN HCL 500 MG PO TABS: 500.0000 mg | ORAL_TABLET | Freq: Two times a day (BID) | ORAL | 3 refills | Status: DC

## 2020-12-10 NOTE — Progress Notes (Signed)
Called pharmacy and logged shots

## 2020-12-10 NOTE — Progress Notes (Signed)
Patient already scheduled for 04/2021

## 2021-01-18 ENCOUNTER — Other Ambulatory Visit: Payer: Self-pay | Admitting: Internal Medicine

## 2021-01-18 DIAGNOSIS — I10 Essential (primary) hypertension: Secondary | ICD-10-CM

## 2021-04-05 ENCOUNTER — Other Ambulatory Visit: Payer: Self-pay

## 2021-04-05 ENCOUNTER — Ambulatory Visit
Admission: RE | Admit: 2021-04-05 | Discharge: 2021-04-05 | Disposition: A | Discharge status: Home / Self Care | Payer: Medicare Other | Source: Ambulatory Visit | Attending: Internal Medicine | Admitting: Internal Medicine

## 2021-04-05 DIAGNOSIS — Z1231 Encounter for screening mammogram for malignant neoplasm of breast: Secondary | ICD-10-CM | POA: Diagnosis not present

## 2021-04-10 ENCOUNTER — Ambulatory Visit (INDEPENDENT_AMBULATORY_CARE_PROVIDER_SITE_OTHER): Payer: Medicare Other | Admitting: Internal Medicine

## 2021-04-10 ENCOUNTER — Other Ambulatory Visit: Payer: Self-pay

## 2021-04-10 ENCOUNTER — Encounter: Payer: Self-pay | Admitting: Internal Medicine

## 2021-04-10 VITALS — BP 124/82 | HR 85 | Temp 98.5°F | Ht 65.0 in | Wt 223.0 lb

## 2021-04-10 DIAGNOSIS — I10 Essential (primary) hypertension: Secondary | ICD-10-CM

## 2021-04-10 DIAGNOSIS — Z1231 Encounter for screening mammogram for malignant neoplasm of breast: Secondary | ICD-10-CM

## 2021-04-10 DIAGNOSIS — Z23 Encounter for immunization: Secondary | ICD-10-CM

## 2021-04-10 DIAGNOSIS — E1159 Type 2 diabetes mellitus with other circulatory complications: Secondary | ICD-10-CM | POA: Diagnosis not present

## 2021-04-10 DIAGNOSIS — N1832 Chronic kidney disease, stage 3b: Secondary | ICD-10-CM | POA: Diagnosis not present

## 2021-04-10 DIAGNOSIS — I152 Hypertension secondary to endocrine disorders: Secondary | ICD-10-CM

## 2021-04-10 LAB — COMPREHENSIVE METABOLIC PANEL
ALT: 13 U/L (ref 0–35)
AST: 15 U/L (ref 0–37)
Albumin: 4.2 g/dL (ref 3.5–5.2)
Alkaline Phosphatase: 94 U/L (ref 39–117)
BUN: 17 mg/dL (ref 6–23)
CO2: 27 mEq/L (ref 19–32)
Calcium: 9.5 mg/dL (ref 8.4–10.5)
Chloride: 102 mEq/L (ref 96–112)
Creatinine, Ser: 1.19 mg/dL (ref 0.40–1.20)
GFR: 44.84 mL/min — ABNORMAL LOW (ref >60.00)
Glucose, Bld: 147 mg/dL — ABNORMAL HIGH (ref 70–99)
Potassium: 4.3 mEq/L (ref 3.5–5.1)
Sodium: 139 mEq/L (ref 135–145)
Total Bilirubin: 0.5 mg/dL (ref 0.2–1.2)
Total Protein: 6.8 g/dL (ref 6.0–8.3)

## 2021-04-10 LAB — CBC WITH DIFFERENTIAL/PLATELET
Basophils Absolute: 0.1 10*3/uL (ref 0.0–0.1)
Basophils Relative: 1 % (ref 0.0–3.0)
Eosinophils Absolute: 0.3 10*3/uL (ref 0.0–0.7)
Eosinophils Relative: 5.1 % — ABNORMAL HIGH (ref 0.0–5.0)
HCT: 37 % (ref 36.0–46.0)
Hemoglobin: 12.2 g/dL (ref 12.0–15.0)
Lymphocytes Relative: 31.7 % (ref 12.0–46.0)
Lymphs Abs: 1.9 10*3/uL (ref 0.7–4.0)
MCHC: 33 g/dL (ref 30.0–36.0)
MCV: 92.5 fl (ref 78.0–100.0)
Monocytes Absolute: 0.5 10*3/uL (ref 0.1–1.0)
Monocytes Relative: 8.2 % (ref 3.0–12.0)
Neutro Abs: 3.2 10*3/uL (ref 1.4–7.7)
Neutrophils Relative %: 54 % (ref 43.0–77.0)
Platelets: 278 10*3/uL (ref 150.0–400.0)
RBC: 4 Mil/uL (ref 3.87–5.11)
RDW: 14.6 % (ref 11.5–15.5)
WBC: 6 10*3/uL (ref 4.0–10.5)

## 2021-04-10 LAB — LIPID PANEL
Cholesterol: 141 mg/dL (ref 0–200)
HDL: 38.7 mg/dL — ABNORMAL LOW (ref >39.00)
LDL Cholesterol: 78 mg/dL (ref 0–99)
NonHDL: 101.92
Total CHOL/HDL Ratio: 4
Triglycerides: 121 mg/dL (ref 0.0–149.0)
VLDL: 24.2 mg/dL (ref 0.0–40.0)

## 2021-04-10 LAB — HEMOGLOBIN A1C: Hgb A1c MFr Bld: 7.4 % — ABNORMAL HIGH (ref 4.6–6.5)

## 2021-04-10 MED ORDER — TETANUS-DIPHTH-ACELL PERTUSSIS 5-2.5-18.5 LF-MCG/0.5 IM SUSP
0.5000 mL | Freq: Once | INTRAMUSCULAR | 0 refills | Status: AC
Start: 2021-04-10 — End: 2021-04-10

## 2021-04-10 NOTE — Progress Notes (Signed)
Chief Complaint  ?Patient presents with  ? Follow-up  ? ?F/u  ?Htn and dm 2 on metformin 500 mg bid glipizide 2.5 xl qd lipitor 80 toprol 50 mg xl and micardis hct 40-12.5 mg qd  ?Doing well no complaints  ?Last labs gfr 41  ? ? ?Review of Systems  ?Constitutional:  Negative for weight loss.  ?HENT:  Negative for hearing loss.   ?Eyes:  Negative for blurred vision.  ?Respiratory:  Negative for shortness of breath.   ?Cardiovascular:  Negative for chest pain.  ?Gastrointestinal:  Negative for abdominal pain and blood in stool.  ?Genitourinary:  Negative for dysuria.  ?Musculoskeletal:  Negative for falls and joint pain.  ?Skin:  Negative for rash.  ?Neurological:  Negative for headaches.  ?Psychiatric/Behavioral:  Negative for depression.   ?Past Medical History:  ?Diagnosis Date  ? Chicken pox   ? Coronary artery disease   ? Diabetes mellitus without complication ([place])   ? Hyperlipidemia   ? Hypertension   ? Urine incontinence   ? ?Past Surgical History:  ?Procedure Laterality Date  ? ABDOMINAL HYSTERECTOMY    ? CARDIAC CATHETERIZATION  05/22/2000  ? ef 60%  ? COLONOSCOPY WITH PROPOFOL N/A 07/14/2017  ? Procedure: COLONOSCOPY WITH PROPOFOL;  Surgeon: Jonathon Bellows, MD;  Location: [facility] ENDOSCOPY;  Service: Gastroenterology;  Laterality: N/A;  ? CORONARY ARTERY BYPASS GRAFT    ? x3 vessel repair 2002  ? EYE SURGERY    ? [facility] follows [facility] in [place]. Still has trouble with right eye vision though able to see peripheral vision   ? US ECHOCARDIOGRAPHY  03/10/2007  ? EF 55-60%  ? ?Family History  ?Problem Relation Age of Onset  ? Hypertension Mother   ? Stroke Mother   ? Diabetes Mother   ? Heart attack Father   ? Hypertension Father   ? Heart attack Brother   ? Breast cancer Neg Hx   ? Cancer Neg Hx   ?     FH lung cancer   ? ?Social History  ? ?Socioeconomic History  ? Marital status: Single  ?  Spouse name: Not on file  ? Number of children: Not on file  ? Years of education: Not on file  ? Highest  education level: Not on file  ?Occupational History  ? Not on file  ?Tobacco Use  ? Smoking status: Former  ?  Types: Cigarettes  ?  Quit date: 02/04/2000  ?  Years since quitting: 21.1  ? Smokeless tobacco: Never  ? Tobacco comments:  ?  smoked 20 years quit 2002 max dose 1ppd   ?Vaping Use  ? Vaping Use: Never used  ?Substance and Sexual Activity  ? Alcohol use: Yes  ? Drug use: No  ? Sexual activity: Never  ?Other Topics Concern  ? Not on file  ?Social History Narrative  ? Former smoker   ? Lives with daughter and kids and son in law   ?   ?   ?   ? ?Social Determinants of Health  ? ?Financial Resource Strain: Not on file  ?Food Insecurity: Not on file  ?Transportation Needs: Not on file  ?Physical Activity: Not on file  ?Stress: Not on file  ?Social Connections: Not on file  ?Intimate Partner Violence: Not on file  ? ?Current Meds  ?Medication Sig  ? aspirin 81 MG tablet Take 81 mg by mouth daily.  ? atorvastatin (LIPITOR) 80 MG tablet Take 1 tablet (80 mg total) by mouth  daily at 6 PM.  ? Cholecalciferol 50 MCG (2000 UT) TABS Take 1 tablet by mouth daily.  ? glipiZIDE (GLUCOTROL XL) 2.5 MG 24 hr tablet Take 1 tablet (2.5 mg total) by mouth daily with breakfast.  ? metFORMIN (GLUCOPHAGE) 500 MG tablet Take 1 tablet (500 mg total) by mouth 2 (two) times daily. With food STOP 1000 mg bid  ? metoprolol succinate (TOPROL-XL) 50 MG 24 hr tablet TAKE ONE TABLET BY MOUTH DAILY d/c 25 dose  ? Multiple Vitamin (MULTIVITAMIN) capsule Take 1 capsule by mouth daily.  ? Tdap (BOOSTRIX) 5-2.5-18.5 LF-MCG/0.5 injection Inject 0.5 mLs into the muscle once for 1 dose.  ? telmisartan-hydrochlorothiazide (MICARDIS HCT) 40-12.5 MG tablet Take 1 tablet by mouth daily.  ? ?No Known Allergies ?No results found for this or any previous visit (from the past 2160 hour(s)). ?Objective  ?Body mass index is 37.11 kg/m?. ?Wt Readings from Last 3 Encounters:  ?04/10/21 223 lb (101.2 kg)  ?11/30/20 225 lb 9.6 oz (102.3 kg)  ?04/26/20 223 lb 3.2  oz (101.2 kg)  ? ?Temp Readings from Last 3 Encounters:  ?04/10/21 98.5 ?F (36.9 ?C) (Oral)  ?11/30/20 98.7 ?F (37.1 ?C) (Oral)  ?04/26/20 97.7 ?F (36.5 ?C) (Oral)  ? ?BP Readings from Last 3 Encounters:  ?04/10/21 124/82  ?11/30/20 136/84  ?04/26/20 (!) 144/96  ? ?Pulse Readings from Last 3 Encounters:  ?04/10/21 85  ?11/30/20 (!) 101  ?04/26/20 95  ? ? ?Physical Exam ?Vitals and nursing note reviewed.  ?Constitutional:   ?   Appearance: Normal appearance. She is well-developed and well-groomed.  ?HENT:  ?   Head: Normocephalic and atraumatic.  ?Eyes:  ?   Conjunctiva/sclera: Conjunctivae normal.  ?   Pupils: Pupils are equal, round, and reactive to light.  ?Cardiovascular:  ?   Rate and Rhythm: Normal rate and regular rhythm.  ?   Heart sounds: Normal heart sounds. No murmur heard. ?Pulmonary:  ?   Effort: Pulmonary effort is normal.  ?   Breath sounds: Normal breath sounds.  ?Abdominal:  ?   General: Abdomen is flat. Bowel sounds are normal.  ?   Tenderness: There is no abdominal tenderness.  ?Musculoskeletal:     ?   General: No tenderness.  ?Skin: ?   General: Skin is warm and dry.  ?Neurological:  ?   General: No focal deficit present.  ?   Mental Status: She is alert and oriented to person, place, and time. Mental status is at baseline.  ?   Cranial Nerves: Cranial nerves 2-12 are intact.  ?   Motor: Motor function is intact.  ?   Coordination: Coordination is intact.  ?   Gait: Gait is intact.  ?Psychiatric:     ?   Attention and Perception: Attention and perception normal.     ?   Mood and Affect: Mood and affect normal.     ?   Speech: Speech normal.     ?   Behavior: Behavior normal. Behavior is cooperative.     ?   Thought Content: Thought content normal.     ?   Cognition and Memory: Cognition and memory normal.     ?   Judgment: Judgment normal.  ? ? ?Assessment  ?Plan  ?Hypertension associated with diabetes ([place]) - Plan: Comprehensive metabolic panel, Lipid panel, Hemoglobin A1c, CBC with  Differential/Platelet ?metformin 500 mg bid glipizide 2.5 xl qd lipitor 80 toprol 50 mg xl and micardis hct 40-12.5 mg qd ?Controlled overall  ?  Rec healthy diet and exercise  ?Prev declined kidney referral  ?Consider take off hctz in the future if kidney function still elevated  ? ?Need for Tdap vaccination - Plan: Tdap (BOOSTRIX) 5-2.5-18.5 LF-MCG/0.5 injection  ? ?HM ?Flu shot utd  ?Pfizer 3/3  ?Prevnar 12/23/16  ?pna 23 utd ?Tdap Rx today again ?Shingrix 2/2 had [facility]  ?  ?Will rec hep b vaccine in future  ?Hep C neg  ?MMR immune  ?  ?02/22/18 mammogram negative ordered  ?03/11/19 negative repeat 03/09/20 negative 04/05/21 negative  ?  ?No need for pap s/p hysterectomy  ?  ?Colonoscopy had 07/2017 negative f/u in 10 years  ?  ?02/22/18 DEXA normal ?  ?-does not qualify CT chest  ?-smoker x 25 years quit in 2001 max 1ppd no FH lung cancer  ?  ?Rec healthy diet and exercise ? ?Provider: Dr. Olivia Mackie McLean-Scocuzza-Internal Medicine  ?

## 2021-04-10 NOTE — Patient Instructions (Addendum)
Metformin 500 mg 2x per day  Make sure pharmacy stops 1000 mg metformin 2x per day Tdap (Tetanus, Diphtheria, Pertussis) Vaccine: What You Need to Know 1. Why get vaccinated? Tdap vaccine can prevent tetanus, diphtheria, and pertussis. Diphtheria and pertussis spread from person to person. Tetanus enters the body through cuts or wounds. TETANUS (T) causes painful stiffening of the muscles. Tetanus can lead to serious health problems, including being unable to open the mouth, having trouble swallowing and breathing, or death. DIPHTHERIA (D) can lead to difficulty breathing, heart failure, paralysis, or death. PERTUSSIS (aP), also known as "whooping cough," can cause uncontrollable, violent coughing that makes it hard to breathe, eat, or drink. Pertussis can be extremely serious especially in babies and young children, causing pneumonia, convulsions, brain damage, or death. In teens and adults, it can cause weight loss, loss of bladder control, passing out, and rib fractures from severe coughing. 2. Tdap vaccine Tdap is only for children 7 years and older, adolescents, and adults.  Adolescents should receive a single dose of Tdap, preferably at age 76 or 12 years. Pregnant people should get a dose of Tdap during every pregnancy, preferably during the early part of the third trimester, to help protect the newborn from pertussis. Infants are most at risk for severe, life-threatening complications from pertussis. Adults who have never received Tdap should get a dose of Tdap. Also, adults should receive a booster dose of either Tdap or Td (a different vaccine that protects against tetanus and diphtheria but not pertussis) every 10 years, or after 5 years in the case of a severe or dirty wound or burn. Tdap may be given at the same time as other vaccines. 3. Talk with your health care provider Tell your vaccine provider if the person getting the vaccine: Has had an allergic reaction after a previous dose  of any vaccine that protects against tetanus, diphtheria, or pertussis, or has any severe, life-threatening allergies Has had a coma, decreased level of consciousness, or prolonged seizures within 7 days after a previous dose of any pertussis vaccine (DTP, DTaP, or Tdap) Has seizures or another nervous system problem Has ever had Guillain-Barr Syndrome (also called "GBS") Has had severe pain or swelling after a previous dose of any vaccine that protects against tetanus or diphtheria In some cases, your health care provider may decide to postpone Tdap vaccination until a future visit. People with minor illnesses, such as a cold, may be vaccinated. People who are moderately or severely ill should usually wait until they recover before getting Tdap vaccine.  Your health care provider can give you more information. 4. Risks of a vaccine reaction Pain, redness, or swelling where the shot was given, mild fever, headache, feeling tired, and nausea, vomiting, diarrhea, or stomachache sometimes happen after Tdap vaccination. People sometimes faint after medical procedures, including vaccination. Tell your provider if you feel dizzy or have vision changes or ringing in the ears.  As with any medicine, there is a very remote chance of a vaccine causing a severe allergic reaction, other serious injury, or death. 5. What if there is a serious problem? An allergic reaction could occur after the vaccinated person leaves the clinic. If you see signs of a severe allergic reaction (hives, swelling of the face and throat, difficulty breathing, a fast heartbeat, dizziness, or weakness), call 9-1-1 and get the person to the nearest hospital. For other signs that concern you, call your health care provider.  Adverse reactions should be reported to the  Vaccine Adverse Event Reporting System (VAERS). Your health care provider will usually file this report, or you can do it yourself. Visit the VAERS website at  www.vaers.LAgents.no or call 818-320-3827. VAERS is only for reporting reactions, and VAERS staff members do not give medical advice. 6. The National Vaccine Injury Compensation Program The [employer] Vaccine Injury Compensation Program (VICP) is a federal program that was created to compensate people who may have been injured by certain vaccines. Claims regarding alleged injury or death due to vaccination have a time limit for filing, which may be as short as two years. Visit the VICP website at SpiritualWord.at or call (703) 452-4207 to learn about the program and about filing a claim. 7. How can I learn more? Ask your health care provider. Call your local or state health department. Visit the website of the Food and Drug Administration (FDA) for vaccine package inserts and additional information at FinderList.no. Contact the Centers for Disease Control and Prevention (CDC): Call 408-462-9194 (1-800-CDC-INFO) or Visit CDC's website at PicCapture.uy. Vaccine Information Statement Tdap (Tetanus, Diphtheria, Pertussis) Vaccine (09/09/2019) This information is not intended to replace advice given to you by your health care provider. Make sure you discuss any questions you have with your health care provider. Document Revised: 10/05/2019 Document Reviewed: 10/05/2019 Elsevier Patient Education  2022 Elsevier Inc.  Semaglutide Injection What is this medication? SEMAGLUTIDE (SEM a GLOO tide) treats type 2 diabetes. It works by increasing insulin levels in your body, which decreases your blood sugar (glucose). It also reduces the amount of sugar released into the blood and slows down your digestion. It can also be used to lower the risk of heart attack and stroke in people with type 2 diabetes. Changes to diet and exercise are often combined with this medication. This medicine may be used for other purposes; ask your health care provider or  pharmacist if you have questions. COMMON BRAND NAME(S): OZEMPIC What should I tell my care team before I take this medication? They need to know if you have any of these conditions: Endocrine tumors (MEN 2) or if someone in your family had these tumors Eye disease, vision problems History of pancreatitis Kidney disease Stomach problems Thyroid cancer or if someone in your family had thyroid cancer An unusual or allergic reaction to semaglutide, other medications, foods, dyes, or preservatives Pregnant or trying to get pregnant Breast-feeding How should I use this medication? This medication is for injection under the skin of your upper leg (thigh), stomach area, or upper arm. It is given once every week (every 7 days). You will be taught how to prepare and give this medication. Use exactly as directed. Take your medication at regular intervals. Do not take it more often than directed. If you use this medication with insulin, you should inject this medication and the insulin separately. Do not mix them together. Do not give the injections right next to each other. Change (rotate) injection sites with each injection. It is important that you put your used needles and syringes in a special sharps container. Do not put them in a trash can. If you do not have a sharps container, call your pharmacist or care team to get one. A special MedGuide will be given to you by the pharmacist with each prescription and refill. Be sure to read this information carefully each time. This medication comes with INSTRUCTIONS FOR USE. Ask your pharmacist for directions on how to use this medication. Read the information carefully. Talk to your pharmacist or care  team if you have questions. Talk to your care team about the use of this medication in children. Special care may be needed. Overdosage: If you think you have taken too much of this medicine contact a poison control center or emergency room at once. NOTE: This  medicine is only for you. Do not share this medicine with others. What if I miss a dose? If you miss a dose, take it as soon as you can within 5 days after the missed dose. Then take your next dose at your regular weekly time. If it has been longer than 5 days after the missed dose, do not take the missed dose. Take the next dose at your regular time. Do not take double or extra doses. If you have questions about a missed dose, contact your care team for advice. What may interact with this medication? Other medications for diabetes Many medications may cause changes in blood sugar, these include: Alcohol containing beverages Antiviral medications for HIV or AIDS Aspirin and aspirin-like medications Certain medications for blood pressure, heart disease, irregular heart beat Chromium Diuretics Female hormones, such as estrogens or progestins, birth control pills Fenofibrate Gemfibrozil Isoniazid Lanreotide Female hormones or anabolic steroids MAOIs like Carbex, Eldepryl, Marplan, Nardil, and Parnate Medications for weight loss Medications for allergies, asthma, cold, or cough Medications for depression, anxiety, or psychotic disturbances Niacin Nicotine NSAIDs, medications for pain and inflammation, like ibuprofen or naproxen Octreotide Pasireotide Pentamidine Phenytoin Probenecid Quinolone antibiotics such as ciprofloxacin, levofloxacin, ofloxacin Some herbal dietary supplements Steroid medications such as prednisone or cortisone Sulfamethoxazole; trimethoprim Thyroid hormones Some medications can hide the warning symptoms of low blood sugar (hypoglycemia). You may need to monitor your blood sugar more closely if you are taking one of these medications. These include: Beta-blockers, often used for high blood pressure or heart problems (examples include atenolol, metoprolol, propranolol) Clonidine Guanethidine Reserpine This list may not describe all possible interactions. Give  your health care provider a list of all the medicines, herbs, non-prescription drugs, or dietary supplements you use. Also tell them if you smoke, drink alcohol, or use illegal drugs. Some items may interact with your medicine. What should I watch for while using this medication? Visit your care team for regular checks on your progress. Drink plenty of fluids while taking this medication. Check with your care team if you get an attack of severe diarrhea, nausea, and vomiting. The loss of too much body fluid can make it dangerous for you to take this medication. A test called the HbA1C (A1C) will be monitored. This is a simple blood test. It measures your blood sugar control over the last 2 to 3 months. You will receive this test every 3 to 6 months. Learn how to check your blood sugar. Learn the symptoms of low and high blood sugar and how to manage them. Always carry a quick-source of sugar with you in case you have symptoms of low blood sugar. Examples include hard sugar candy or glucose tablets. Make sure others know that you can choke if you eat or drink when you develop serious symptoms of low blood sugar, such as seizures or unconsciousness. They must get medical help at once. Tell your care team if you have high blood sugar. You might need to change the dose of your medication. If you are sick or exercising more than usual, you might need to change the dose of your medication. Do not skip meals. Ask your care team if you should avoid alcohol. Many nonprescription  cough and cold products contain sugar or alcohol. These can affect blood sugar. Pens should never be shared. Even if the needle is changed, sharing may result in passing of viruses like hepatitis or HIV. Wear a medical ID bracelet or chain, and carry a card that describes your disease and details of your medication and dosage times. Do not become pregnant while taking this medication. Women should inform their care team if they wish to  become pregnant or think they might be pregnant. There is a potential for serious side effects to an unborn child. Talk to your care team for more information. What side effects may I notice from receiving this medication? Side effects that you should report to your care team as soon as possible: Allergic reactions--skin rash, itching, hives, swelling of the face, lips, tongue, or throat Change in vision Dehydration--increased thirst, dry mouth, feeling faint or lightheaded, headache, dark yellow or brown urine Gallbladder problems--severe stomach pain, nausea, vomiting, fever Heart palpitations--rapid, pounding, or irregular heartbeat Kidney injury--decrease in the amount of urine, swelling of the ankles, hands, or feet Pancreatitis--severe stomach pain that spreads to your back or gets worse after eating or when touched, fever, nausea, vomiting Thyroid cancer--new mass or lump in the neck, pain or trouble swallowing, trouble breathing, hoarseness Side effects that usually do not require medical attention (report to your care team if they continue or are bothersome): Diarrhea Loss of appetite Nausea Stomach pain Vomiting This list may not describe all possible side effects. Call your doctor for medical advice about side effects. You may report side effects to FDA at 1-800-FDA-1088. Where should I keep my medication? Keep out of the reach of children. Store unopened pens in a refrigerator between 2 and 8 degrees C (36 and 46 degrees F). Do not freeze. Protect from light and heat. After you first use the pen, it can be stored for 56 days at room temperature between 15 and 30 degrees C (59 and 86 degrees F) or in a refrigerator. Throw away your used pen after 56 days or after the expiration date, whichever comes first. Do not store your pen with the needle attached. If the needle is left on, medication may leak from the pen. NOTE: This sheet is a summary. It may not cover all possible  information. If you have questions about this medicine, talk to your doctor, pharmacist, or health care provider.  2022 Elsevier/Gold Standard (2020-04-26 00:00:00)

## 2021-10-08 ENCOUNTER — Telehealth: Payer: Self-pay | Admitting: Internal Medicine

## 2021-10-08 NOTE — Telephone Encounter (Signed)
Pt daughter wants to be called regarding a medication the pt is taking. Pt daughter did not know the name of the medication

## 2021-10-11 NOTE — Telephone Encounter (Signed)
We increased the dose to toprol 50 ml 1 daily  Call pharmacy and d/c 25 and call pt and daughter and advise

## 2021-11-06 ENCOUNTER — Ambulatory Visit (INDEPENDENT_AMBULATORY_CARE_PROVIDER_SITE_OTHER): Payer: Medicare Other

## 2021-11-06 ENCOUNTER — Encounter: Payer: Self-pay | Admitting: Internal Medicine

## 2021-11-06 ENCOUNTER — Ambulatory Visit (INDEPENDENT_AMBULATORY_CARE_PROVIDER_SITE_OTHER): Payer: Medicare Other | Admitting: Internal Medicine

## 2021-11-06 VITALS — BP 134/62 | HR 78 | Temp 98.6°F | Ht 65.0 in | Wt 223.0 lb

## 2021-11-06 DIAGNOSIS — N1832 Chronic kidney disease, stage 3b: Secondary | ICD-10-CM | POA: Diagnosis not present

## 2021-11-06 DIAGNOSIS — Z23 Encounter for immunization: Secondary | ICD-10-CM

## 2021-11-06 DIAGNOSIS — I152 Hypertension secondary to endocrine disorders: Secondary | ICD-10-CM

## 2021-11-06 DIAGNOSIS — I10 Essential (primary) hypertension: Secondary | ICD-10-CM

## 2021-11-06 DIAGNOSIS — E785 Hyperlipidemia, unspecified: Secondary | ICD-10-CM

## 2021-11-06 DIAGNOSIS — E1159 Type 2 diabetes mellitus with other circulatory complications: Secondary | ICD-10-CM | POA: Diagnosis not present

## 2021-11-06 DIAGNOSIS — R9431 Abnormal electrocardiogram [ECG] [EKG]: Secondary | ICD-10-CM | POA: Diagnosis not present

## 2021-11-06 DIAGNOSIS — E1165 Type 2 diabetes mellitus with hyperglycemia: Secondary | ICD-10-CM | POA: Diagnosis not present

## 2021-11-06 DIAGNOSIS — N1831 Chronic kidney disease, stage 3a: Secondary | ICD-10-CM

## 2021-11-06 DIAGNOSIS — H5461 Unqualified visual loss, right eye, normal vision left eye: Secondary | ICD-10-CM | POA: Diagnosis not present

## 2021-11-06 DIAGNOSIS — Z2911 Encounter for prophylactic immunotherapy for respiratory syncytial virus (RSV): Secondary | ICD-10-CM | POA: Diagnosis not present

## 2021-11-06 DIAGNOSIS — R009 Unspecified abnormalities of heart beat: Secondary | ICD-10-CM

## 2021-11-06 DIAGNOSIS — I2510 Atherosclerotic heart disease of native coronary artery without angina pectoris: Secondary | ICD-10-CM | POA: Diagnosis not present

## 2021-11-06 LAB — CBC WITH DIFFERENTIAL/PLATELET
Basophils Absolute: 0.1 10*3/uL (ref 0.0–0.1)
Basophils Relative: 1 % (ref 0.0–3.0)
Eosinophils Absolute: 0.4 10*3/uL (ref 0.0–0.7)
Eosinophils Relative: 6.5 % — ABNORMAL HIGH (ref 0.0–5.0)
HCT: 38.6 % (ref 36.0–46.0)
Hemoglobin: 12.5 g/dL (ref 12.0–15.0)
Lymphocytes Relative: 32.7 % (ref 12.0–46.0)
Lymphs Abs: 2.2 10*3/uL (ref 0.7–4.0)
MCHC: 32.3 g/dL (ref 30.0–36.0)
MCV: 93.7 fl (ref 78.0–100.0)
Monocytes Absolute: 0.6 10*3/uL (ref 0.1–1.0)
Monocytes Relative: 8.4 % (ref 3.0–12.0)
Neutro Abs: 3.4 10*3/uL (ref 1.4–7.7)
Neutrophils Relative %: 51.4 % (ref 43.0–77.0)
Platelets: 276 10*3/uL (ref 150.0–400.0)
RBC: 4.12 Mil/uL (ref 3.87–5.11)
RDW: 14.9 % (ref 11.5–15.5)
WBC: 6.7 10*3/uL (ref 4.0–10.5)

## 2021-11-06 LAB — COMPREHENSIVE METABOLIC PANEL
ALT: 14 U/L (ref 0–35)
AST: 15 U/L (ref 0–37)
Albumin: 4.3 g/dL (ref 3.5–5.2)
Alkaline Phosphatase: 98 U/L (ref 39–117)
BUN: 21 mg/dL (ref 6–23)
CO2: 28 mEq/L (ref 19–32)
Calcium: 9.8 mg/dL (ref 8.4–10.5)
Chloride: 101 mEq/L (ref 96–112)
Creatinine, Ser: 1.28 mg/dL — ABNORMAL HIGH (ref 0.40–1.20)
GFR: 40.92 mL/min — ABNORMAL LOW (ref >60.00)
Glucose, Bld: 152 mg/dL — ABNORMAL HIGH (ref 70–99)
Potassium: 4.5 mEq/L (ref 3.5–5.1)
Sodium: 139 mEq/L (ref 135–145)
Total Bilirubin: 0.6 mg/dL (ref 0.2–1.2)
Total Protein: 7.2 g/dL (ref 6.0–8.3)

## 2021-11-06 LAB — LIPID PANEL
Cholesterol: 146 mg/dL (ref 0–200)
HDL: 35.9 mg/dL — ABNORMAL LOW (ref >39.00)
LDL Cholesterol: 85 mg/dL (ref 0–99)
NonHDL: 110.04
Total CHOL/HDL Ratio: 4
Triglycerides: 127 mg/dL (ref 0.0–149.0)
VLDL: 25.4 mg/dL (ref 0.0–40.0)

## 2021-11-06 LAB — HEMOGLOBIN A1C: Hgb A1c MFr Bld: 8 % — ABNORMAL HIGH (ref 4.6–6.5)

## 2021-11-06 MED ORDER — GLIPIZIDE ER 2.5 MG PO TB24: 2.5000 mg | ORAL_TABLET | Freq: Every day | ORAL | 3 refills | Status: DC

## 2021-11-06 MED ORDER — ATORVASTATIN CALCIUM 80 MG PO TABS: 80.0000 mg | ORAL_TABLET | Freq: Every day | ORAL | 3 refills | Status: DC

## 2021-11-06 MED ORDER — RSVPREF3 VAC RECOMB ADJUVANTED 120 MCG/0.5ML IM SUSR: 0.5000 mL | Freq: Once | INTRAMUSCULAR | 0 refills | Status: AC

## 2021-11-06 MED ORDER — METOPROLOL SUCCINATE ER 50 MG PO TB24: ORAL_TABLET | ORAL | 3 refills | Status: DC

## 2021-11-06 MED ORDER — TELMISARTAN-HCTZ 40-12.5 MG PO TABS: 1.0000 | ORAL_TABLET | Freq: Every day | ORAL | 3 refills | Status: DC

## 2021-11-06 NOTE — Addendum Note (Signed)
Addended by: Orland Mustard on: 11/06/2021 05:09 PM   Modules accepted: Orders

## 2021-11-06 NOTE — Progress Notes (Addendum)
Chief Complaint  Patient presents with   Follow-up    F/u pt is fasting    F/u  1. Dm 2 and htn bp sl elevated today on toprol 50 mg xl and micardis 40-12.5 mg qd  Feeling better since metformin dose reduced 500 mg bid also on glipizide 2. Ckd 3a/b agreeable to see renal and was doing a lot of advil rec only tylenol     Review of Systems  Constitutional:  Negative for weight loss.  HENT:  Negative for hearing loss.   Eyes:  Negative for blurred vision.  Respiratory:  Negative for shortness of breath.   Cardiovascular:  Negative for chest pain.  Gastrointestinal:  Negative for abdominal pain and blood in stool.  Genitourinary:  Negative for dysuria.  Musculoskeletal:  Negative for falls and joint pain.  Skin:  Negative for rash.  Neurological:  Negative for headaches.  Psychiatric/Behavioral:  Negative for depression.    Past Medical History:  Diagnosis Date   Chicken pox    Coronary artery disease    Diabetes mellitus without complication ([place])    Hyperlipidemia    Hypertension    Urine incontinence    Past Surgical History:  Procedure Laterality Date   ABDOMINAL HYSTERECTOMY     CARDIAC CATHETERIZATION  05/22/2000   ef 60%   COLONOSCOPY WITH PROPOFOL N/A 07/14/2017   Procedure: COLONOSCOPY WITH PROPOFOL;  Surgeon: Jonathon Bellows, MD;  Location: [facility] ENDOSCOPY;  Service: Gastroenterology;  Laterality: N/A;   CORONARY ARTERY BYPASS GRAFT     x3 vessel repair 2002   EYE SURGERY     detached retina follows [facility] in [place]. Still has trouble with right eye vision though able to see peripheral vision    US ECHOCARDIOGRAPHY  03/10/2007   EF 55-60%   Family History  Problem Relation Age of Onset   Hypertension Mother    Stroke Mother    Diabetes Mother    Heart attack Father    Hypertension Father    Heart attack Brother    Breast cancer Neg Hx    Cancer Neg Hx        FH lung cancer    Social History   Socioeconomic History   Marital status: Single     Spouse name: Not on file   Number of children: Not on file   Years of education: Not on file   Highest education level: Not on file  Occupational History   Not on file  Tobacco Use   Smoking status: Former    Types: Cigarettes    Quit date: 02/04/2000    Years since quitting: 21.7   Smokeless tobacco: Never   Tobacco comments:    smoked 20 years quit 2002 max dose 1ppd   Vaping Use   Vaping Use: Never used  Substance and Sexual Activity   Alcohol use: Yes   Drug use: No   Sexual activity: Never  Other Topics Concern   Not on file  Social History Narrative   Former smoker    Lives with daughter and kids and [place] in [place]             Social Determinants of Health   Financial Resource Strain: Not on file  Food Insecurity: Not on file  Transportation Needs: Not on file  Physical Activity: Not on file  Stress: Not on file  Social Connections: Not on file  Intimate Partner Violence: Not on file   Current Meds  Medication Sig   aspirin  81 MG tablet Take 81 mg by mouth daily.   Cholecalciferol 50 MCG (2000 UT) TABS Take 1 tablet by mouth daily.   metFORMIN (GLUCOPHAGE) 500 MG tablet Take 1 tablet (500 mg total) by mouth 2 (two) times daily. With food STOP 1000 mg bid   Multiple Vitamin (MULTIVITAMIN) capsule Take 1 capsule by mouth daily.   RSV vaccine recomb adjuvanted (AREXVY) 120 MCG/0.5ML injection Inject 0.5 mLs into the muscle once for 1 dose.   [DISCONTINUED] atorvastatin (LIPITOR) 80 MG tablet Take 1 tablet (80 mg total) by mouth daily at 6 PM.   [DISCONTINUED] glipiZIDE (GLUCOTROL XL) 2.5 MG 24 hr tablet Take 1 tablet (2.5 mg total) by mouth daily with breakfast.   [DISCONTINUED] metoprolol succinate (TOPROL-XL) 50 MG 24 hr tablet TAKE ONE TABLET BY MOUTH DAILY d/c 25 dose   [DISCONTINUED] telmisartan-hydrochlorothiazide (MICARDIS HCT) 40-12.5 MG tablet Take 1 tablet by mouth daily.   No Known Allergies No results found for this or any previous visit (from the past  2160 hour(s)). Objective  Body mass index is 37.11 kg/m. Wt Readings from Last 3 Encounters:  11/06/21 223 lb (101.2 kg)  04/10/21 223 lb (101.2 kg)  11/30/20 225 lb 9.6 oz (102.3 kg)   Temp Readings from Last 3 Encounters:  11/06/21 98.6 F (37 C) (Oral)  04/10/21 98.5 F (36.9 C) (Oral)  11/30/20 98.7 F (37.1 C) (Oral)   BP Readings from Last 3 Encounters:  11/06/21 134/62  04/10/21 124/82  11/30/20 136/84   Pulse Readings from Last 3 Encounters:  11/06/21 78  04/10/21 85  11/30/20 (!) 101    Physical Exam Vitals and nursing note reviewed.  Constitutional:      Appearance: Normal appearance. She is well-developed and well-groomed.  HENT:     Head: Normocephalic and atraumatic.  Eyes:     Conjunctiva/sclera: Conjunctivae normal.     Pupils: Pupils are equal, round, and reactive to light.  Cardiovascular:     Rate and Rhythm: Normal rate and regular rhythm.     Heart sounds: Normal heart sounds. No murmur heard.    Comments: Irregular heart beat Pulmonary:     Effort: Pulmonary effort is normal.     Breath sounds: Normal breath sounds.  Abdominal:     General: Abdomen is flat. Bowel sounds are normal.     Tenderness: There is no abdominal tenderness.  Musculoskeletal:        General: No tenderness.  Skin:    General: Skin is warm and dry.  Neurological:     General: No focal deficit present.     Mental Status: She is alert and oriented to person, place, and time. Mental status is at baseline.     Cranial Nerves: Cranial nerves 2-12 are intact.     Motor: Motor function is intact.     Coordination: Coordination is intact.     Gait: Gait is intact.  Psychiatric:        Attention and Perception: Attention and perception normal.        Mood and Affect: Mood and affect normal.        Speech: Speech normal.        Behavior: Behavior normal. Behavior is cooperative.        Thought Content: Thought content normal.        Cognition and Memory: Cognition and  memory normal.        Judgment: Judgment normal.     Assessment  Plan  Hypertension associated with  diabetes ([place]) - Plan: Comprehensive metabolic panel, Lipid panel, CBC with Differential/Platelet, Hemoglobin A1c, Urinalysis, Routine w reflex microscopic, Microalbumin / creatinine urine ratio, Sodium, urine, random toprol 50 mg xl and micardis 40-12.5 mg qd  Feeling better since metformin dose reduced 500 mg bid also on glipizide Both at goal    Stage 3a/b chronic kidney disease ([place]) - Plan: Comprehensive metabolic panel, Urinalysis, Routine w reflex microscopic, Microalbumin / creatinine urine ratio, Sodium, urine, random Agreeable to renal in the future referred Dr. Candiss Norse  Heart beat abnormality - Plan: EKG 12-Lead NSR T wave inversions seen on prior EKG ?pulm disease cxr today  Will refer back to cards likely will need holter  Referred [facility] cards in [facility] location  Hyperlipidemia, unspecified hyperlipidemia type - Plan: atorvastatin (LIPITOR) 80 MG tablet   Vision loss, right eye  Rec f/u eye MD  HM Flu shot utd given today [place] 3/3 will consider Prevnar 12/23/16  Rx rsv vaccine today pna 23 utd Prevnar 20 due 01/19/2024  Tdap Rx given per pt had [facility] Shingrix 2/2 had [facility]    Will rec hep b vaccine in future  Hep C neg  MMR immune    02/22/18 mammogram negative ordered  03/11/19 negative repeat 03/09/20 negative 04/05/21 negative  ordered 2024    No need for pap s/p hysterectomy    Colonoscopy had 07/2017 negative f/u in 10 years    02/22/18 DEXA normal   -does not qualify CT chest  -smoker x 25 years quit in 2001 max 1ppd no FH lung cancer    Rec healthy diet and exercise   Provider: Dr. Olivia Mackie McLean-Scocuzza-Internal Medicine

## 2021-11-06 NOTE — Patient Instructions (Addendum)
Consider new Covid 19 shot  Flu shot given today  Consider rsv vaccine    Mammogram due 04/06/2022

## 2021-11-06 NOTE — Addendum Note (Signed)
Addended by: Orland Mustard on: 11/06/2021 09:44 AM   Modules accepted: Orders

## 2021-11-06 NOTE — Addendum Note (Signed)
Addended by: Orland Mustard on: 11/06/2021 05:16 PM   Modules accepted: Orders

## 2021-11-07 ENCOUNTER — Encounter: Payer: Self-pay | Admitting: Internal Medicine

## 2021-11-07 DIAGNOSIS — I700 Atherosclerosis of aorta: Secondary | ICD-10-CM | POA: Insufficient documentation

## 2021-11-07 LAB — URINALYSIS, ROUTINE W REFLEX MICROSCOPIC
Bacteria, UA: NONE SEEN /HPF
Bilirubin Urine: NEGATIVE
Glucose, UA: NEGATIVE
Hgb urine dipstick: NEGATIVE
Hyaline Cast: NONE SEEN /LPF
Ketones, ur: NEGATIVE
Nitrite: NEGATIVE
Protein, ur: NEGATIVE
RBC / HPF: NONE SEEN /HPF (ref 0–2)
Specific Gravity, Urine: 1.016 (ref 1.001–1.035)
pH: <=5 (ref 5.0–8.0)

## 2021-11-07 LAB — MICROALBUMIN / CREATININE URINE RATIO
Creatinine, Urine: 139 mg/dL (ref 20–275)
Microalb Creat Ratio: 4 mcg/mg creat (ref <30)
Microalb, Ur: 0.5 mg/dL

## 2021-11-07 LAB — SODIUM, URINE, RANDOM: Sodium, Ur: 114 mmol/L (ref 28–272)

## 2021-11-07 LAB — MICROSCOPIC MESSAGE

## 2021-11-20 ENCOUNTER — Ambulatory Visit: Payer: Medicare Other | Attending: Cardiology | Admitting: Cardiology

## 2021-11-20 ENCOUNTER — Encounter: Payer: Self-pay | Admitting: Cardiology

## 2021-11-20 VITALS — BP 140/80 | HR 74 | Ht 65.0 in | Wt 223.0 lb

## 2021-11-20 DIAGNOSIS — R9431 Abnormal electrocardiogram [ECG] [EKG]: Secondary | ICD-10-CM

## 2021-11-20 DIAGNOSIS — I700 Atherosclerosis of aorta: Secondary | ICD-10-CM

## 2021-11-20 DIAGNOSIS — E7800 Pure hypercholesterolemia, unspecified: Secondary | ICD-10-CM | POA: Diagnosis not present

## 2021-11-20 DIAGNOSIS — E119 Type 2 diabetes mellitus without complications: Secondary | ICD-10-CM

## 2021-11-20 DIAGNOSIS — I10 Essential (primary) hypertension: Secondary | ICD-10-CM | POA: Insufficient documentation

## 2021-11-20 DIAGNOSIS — N1831 Chronic kidney disease, stage 3a: Secondary | ICD-10-CM

## 2021-11-20 DIAGNOSIS — I2510 Atherosclerotic heart disease of native coronary artery without angina pectoris: Secondary | ICD-10-CM

## 2021-11-20 NOTE — Progress Notes (Signed)
Cardiology Office Note:    Date:  11/20/2021   ID:  Haley Curtis, DOB October 10, 1945, MRN [id number]  PCP:  Curtis, Haley Glow, MD   [facility] Providers Cardiologist:  Candee Furbish, MD     Referring MD: Curtis, Haley Mackie *    History of Present Illness:    Haley Curtis is a 76 y.o. female here for the evaluation of abnormal EKG by referring Dr. Olivia Mackie Curtis.  Has diabetes with hypertension with chronic kidney disease stage IIIa with prior cardiac catheterization in 2002 resulting in CABG x3.  EF 60%.  His brother had an MI.  Former smoker quit in 2002.  Had an EKG performed on 11/06/2021 personally reviewed and interpreted that showed normal sinus rhythm with T wave inversions especially in the anterior leads.  Left anterior fascicular block also noted.  Prior EKG from 2016 showed T wave inversion in the anterior precordial leads fairly nonspecific.  Prior chest x-ray from 11/06/2021 reviewed, normal.  Prior office notes reviewed.  Lab work reviewed.  Prior echocardiogram reviewed.  Referral notes reviewed.  Here with her daughter Haley Curtis.  She assist with history.  Past Medical History:  Diagnosis Date   Chicken pox    Coronary artery disease    Diabetes mellitus without complication ([place])    Hyperlipidemia    Hypertension    Urine incontinence     Past Surgical History:  Procedure Laterality Date   ABDOMINAL HYSTERECTOMY     CARDIAC CATHETERIZATION  05/22/2000   ef 60%   COLONOSCOPY WITH PROPOFOL N/A 07/14/2017   Procedure: COLONOSCOPY WITH PROPOFOL;  Surgeon: Jonathon Bellows, MD;  Location: [facility] ENDOSCOPY;  Service: Gastroenterology;  Laterality: N/A;   CORONARY ARTERY BYPASS GRAFT     x3 vessel repair 2002   EYE SURGERY     [facility] [place]. Still has trouble with right eye vision though able to see peripheral vision    US ECHOCARDIOGRAPHY  03/10/2007   EF 55-60%    Current Medications: Current Meds  Medication  Sig   aspirin 81 MG tablet Take 81 mg by mouth daily.   atorvastatin (LIPITOR) 80 MG tablet Take 1 tablet (80 mg total) by mouth daily at 6 PM.   Cholecalciferol 50 MCG (2000 UT) TABS Take 1 tablet by mouth daily.   glipiZIDE (GLUCOTROL XL) 2.5 MG 24 hr tablet Take 1 tablet (2.5 mg total) by mouth daily with breakfast.   metFORMIN (GLUCOPHAGE) 500 MG tablet Take 1 tablet (500 mg total) by mouth 2 (two) times daily. With food STOP 1000 mg bid   metoprolol succinate (TOPROL-XL) 50 MG 24 hr tablet TAKE ONE TABLET BY MOUTH DAILY d/c 25 dose   Multiple Vitamin (MULTIVITAMIN) capsule Take 1 capsule by mouth daily.   telmisartan-hydrochlorothiazide (MICARDIS HCT) 40-12.5 MG tablet Take 1 tablet by mouth daily.     Allergies:   Patient has no known allergies.   Social History   Socioeconomic History   Marital status: Single    Spouse name: Not on file   Number of children: Not on file   Years of education: Not on file   Highest education level: Not on file  Occupational History   Not on file  Tobacco Use   Smoking status: Former    Types: Cigarettes    Quit date: 02/04/2000    Years since quitting: 21.8   Smokeless tobacco: Never   Tobacco comments:    smoked 20 years quit 2002 max dose 1ppd  Vaping Use   Vaping Use: Never used  Substance and Sexual Activity   Alcohol use: Yes   Drug use: No   Sexual activity: Never  Other Topics Concern   Not on file  Social History Narrative   Former smoker    Lives with daughter and kids and son in [place]             Social Determinants of Health   Financial Resource Strain: Not on file  Food Insecurity: Not on file  Transportation Needs: Not on file  Physical Activity: Not on file  Stress: Not on file  Social Connections: Not on file     Family History: The patient's family history includes Diabetes in her mother; Heart attack in her brother and father; Hypertension in her father and mother; Stroke in her mother. There is no history  of Breast cancer or Cancer.  ROS:   Please see the history of present illness.    Denies any fevers chills nausea vomiting syncope bleeding all other systems reviewed and are negative.  EKGs/Labs/Other Studies Reviewed:    The following studies were reviewed today: ECHO 2009: mild LVH with impaired relaxation, mild aortic sclerosis  EKG: As described above personally reviewed.  Multiple EKGs previously also reviewed.  Recent Labs: 11/30/2020: TSH 4.14 11/06/2021: ALT 14; BUN 21; Creatinine, Ser 1.28; Hemoglobin 12.5; Platelets 276.0; Potassium 4.5; Sodium 139  Recent Lipid Panel    Component Value Date/Time   CHOL 146 11/06/2021 0949   TRIG 127.0 11/06/2021 0949   HDL 35.90 (L) 11/06/2021 0949   CHOLHDL 4 11/06/2021 0949   VLDL 25.4 11/06/2021 0949   LDLCALC 85 11/06/2021 0949   LDLDIRECT 183.5 10/15/2012 1520     Risk Assessment/Calculations:              Physical Exam:    VS:  BP (!) 140/80 (BP Location: Left Arm, Patient Position: Sitting, Cuff Size: Normal)   Pulse 74   Ht 5\' 5"  (1.651 m)   Wt 223 lb (101.2 kg)   SpO2 98%   BMI 37.11 kg/m     Wt Readings from Last 3 Encounters:  11/20/21 223 lb (101.2 kg)  11/06/21 223 lb (101.2 kg)  04/10/21 223 lb (101.2 kg)     GEN:  Well nourished, well developed in no acute distress HEENT: Normal NECK: No JVD; No carotid bruits LYMPHATICS: No lymphadenopathy CARDIAC: RRR, no murmurs, no rubs, gallops prior CABG scar well-healed RESPIRATORY:  Clear to auscultation without rales, wheezing or rhonchi  ABDOMEN: Soft, non-tender, non-distended MUSCULOSKELETAL:  No edema; No deformity  SKIN: Warm and dry NEUROLOGIC:  Alert and oriented x 3 PSYCHIATRIC:  Normal affect   ASSESSMENT:    1. Abnormal EKG   2. Coronary artery disease involving native coronary artery of native heart without angina pectoris   3. Aortic atherosclerosis (HCC)   4. Pure hypercholesterolemia   5. Stage 3a chronic kidney disease (HCC)   6.  Diabetes mellitus with coincident hypertension (HCC)    PLAN:    In order of problems listed above:  Coronary artery disease status post CABG - Previous seen by Dr. 06/10/21 in 2016 and Dr. 2017 in 2017. -Aspirin statin beta-blocker.  No changes made.  Diabetes with hypertension and chronic kidney disease stage IIIa - Prior hemoglobin A1c reviewed.  Management per primary team. -Medications reviewed for hypertension as well.  Abnormal EKG  -I will check an echocardiogram to ensure proper structure and function given the last one  was in 2009.  Her EKGs in the past have shown some nonspecific T wave changes.  Currently most recent ECG is not significantly different.  Hyperlipidemia - Currently on high intensity statin atorvastatin 80 mg a day.  Last LDL was 85.  Ultimately goal is less than 70.  Continue with diet exercise diabetes control.  If necessary in the future, could add Zetia 10 mg.   We will touch base in 1 year.  Medication Adjustments/Labs and Tests Ordered: Current medicines are reviewed at length with the patient today.  Concerns regarding medicines are outlined above.  Orders Placed This Encounter  Procedures   ECHOCARDIOGRAM COMPLETE   No orders of the defined types were placed in this encounter.   Patient Instructions  Medication Instructions:  The current medical regimen is effective;  continue present plan and medications.  *If you need a refill on your cardiac medications before your next appointment, please call your pharmacy*  Testing/Procedures: Your physician has requested that you have an echocardiogram. Echocardiography is a painless test that uses sound waves to create images of your heart. It provides your doctor with information about the size and shape of your heart and how well your heart's chambers and valves are working. This procedure takes approximately one hour. There are no restrictions for this procedure. Please do NOT wear  cologne, perfume, aftershave, or lotions (deodorant is allowed). Please arrive 15 minutes prior to your appointment time.  Follow-Up: At [facility], you and your health needs are our priority.  As part of our continuing mission to provide you with exceptional heart care, we have created designated Provider Care Teams.  These Care Teams include your primary Cardiologist (physician) and Advanced Practice Providers (APPs -  Physician Assistants and Nurse Practitioners) who all work together to provide you with the care you need, when you need it.  We recommend signing up for the patient portal called "MyChart".  Sign up information is provided on this After Visit Summary.  MyChart is used to connect with patients for Virtual Visits (Telemedicine).  Patients are able to view lab/test results, encounter notes, upcoming appointments, etc.  Non-urgent messages can be sent to your provider as well.   To learn more about what you can do with MyChart, go to [email or web address].    Your next appointment:   1 year(s)  The format for your next appointment:   In Person  Provider:   Dr Donato Schultz   Important Information About Sugar         Signed, Donato Schultz, MD  11/20/2021 11:01 AM    [facility]

## 2021-11-20 NOTE — Patient Instructions (Addendum)
Medication Instructions:  The current medical regimen is effective;  continue present plan and medications.  *If you need a refill on your cardiac medications before your next appointment, please call your pharmacy*  Testing/Procedures: Your physician has requested that you have an echocardiogram. Echocardiography is a painless test that uses sound waves to create images of your heart. It provides your doctor with information about the size and shape of your heart and how well your heart's chambers and valves are working. This procedure takes approximately one hour. There are no restrictions for this procedure. Please do NOT wear cologne, perfume, aftershave, or lotions (deodorant is allowed). Please arrive 15 minutes prior to your appointment time.  Follow-Up: At [facility], you and your health needs are our priority.  As part of our continuing mission to provide you with exceptional heart care, we have created designated Provider Care Teams.  These Care Teams include your primary Cardiologist (physician) and Advanced Practice Providers (APPs -  Physician Assistants and Nurse Practitioners) who all work together to provide you with the care you need, when you need it.  We recommend signing up for the patient portal called "MyChart".  Sign up information is provided on this After Visit Summary.  MyChart is used to connect with patients for Virtual Visits (Telemedicine).  Patients are able to view lab/test results, encounter notes, upcoming appointments, etc.  Non-urgent messages can be sent to your provider as well.   To learn more about what you can do with MyChart, go to [email or web address].    Your next appointment:   1 year(s)  The format for your next appointment:   In Person  Provider:   Dr Mark Skains      Important Information About Sugar       

## 2021-11-25 ENCOUNTER — Ambulatory Visit: Payer: Medicare Other | Attending: Cardiology

## 2021-11-25 DIAGNOSIS — R9431 Abnormal electrocardiogram [ECG] [EKG]: Secondary | ICD-10-CM | POA: Diagnosis not present

## 2021-11-25 LAB — ECHOCARDIOGRAM COMPLETE
AV Mean grad: 2 mmHg
AV Peak grad: 3.4 mmHg
Ao pk vel: 0.92 m/s
Area-P 1/2: 4.96 cm2
Calc EF: 79 %
P 1/2 time: 636 msec
S' Lateral: 2.9 cm
Single Plane A2C EF: 80.6 %
Single Plane A4C EF: 76.9 %

## 2021-11-29 ENCOUNTER — Telehealth: Payer: Self-pay | Admitting: *Deleted

## 2021-11-29 DIAGNOSIS — I77819 Aortic ectasia, unspecified site: Secondary | ICD-10-CM

## 2021-11-29 DIAGNOSIS — I2510 Atherosclerotic heart disease of native coronary artery without angina pectoris: Secondary | ICD-10-CM

## 2021-11-29 NOTE — Telephone Encounter (Signed)
Normal pump function. Reassuring  Aorta was mildly dilated 41 mm.  Let's repeat ECHO in one year to monitor  Candee Furbish, MD   Pt aware of results and that the testing will be repeated in 1 yr.

## 2021-12-17 ENCOUNTER — Telehealth: Payer: Self-pay | Admitting: Internal Medicine

## 2021-12-17 ENCOUNTER — Other Ambulatory Visit: Payer: Self-pay

## 2021-12-17 DIAGNOSIS — E1165 Type 2 diabetes mellitus with hyperglycemia: Secondary | ICD-10-CM

## 2021-12-17 MED ORDER — METFORMIN HCL 500 MG PO TABS: 500.0000 mg | ORAL_TABLET | Freq: Two times a day (BID) | ORAL | 0 refills | Status: DC

## 2021-12-17 NOTE — Telephone Encounter (Signed)
Refill sent.

## 2021-12-17 NOTE — Telephone Encounter (Signed)
Pt need refill on metformin sent to [facility]

## 2021-12-18 MED ORDER — METFORMIN HCL 500 MG PO TABS: 500.0000 mg | ORAL_TABLET | Freq: Two times a day (BID) | ORAL | 1 refills | Status: DC

## 2021-12-18 NOTE — Addendum Note (Signed)
Addended by: Warden Fillers on: 12/18/2021 04:20 PM   Modules accepted: Orders

## 2021-12-18 NOTE — Addendum Note (Signed)
Addended by: Warden Fillers on: 12/18/2021 04:18 PM   Modules accepted: Orders

## 2022-05-08 ENCOUNTER — Ambulatory Visit (INDEPENDENT_AMBULATORY_CARE_PROVIDER_SITE_OTHER): Payer: Medicare Other | Admitting: Family Medicine

## 2022-05-08 ENCOUNTER — Encounter: Payer: Self-pay | Admitting: Family Medicine

## 2022-05-08 VITALS — BP 130/80 | HR 73 | Temp 98.3°F | Ht 65.0 in | Wt 221.2 lb

## 2022-05-08 DIAGNOSIS — N1832 Chronic kidney disease, stage 3b: Secondary | ICD-10-CM

## 2022-05-08 DIAGNOSIS — E782 Mixed hyperlipidemia: Secondary | ICD-10-CM | POA: Diagnosis not present

## 2022-05-08 DIAGNOSIS — I152 Hypertension secondary to endocrine disorders: Secondary | ICD-10-CM

## 2022-05-08 DIAGNOSIS — E1159 Type 2 diabetes mellitus with other circulatory complications: Secondary | ICD-10-CM | POA: Diagnosis not present

## 2022-05-08 DIAGNOSIS — I700 Atherosclerosis of aorta: Secondary | ICD-10-CM

## 2022-05-08 DIAGNOSIS — Z1231 Encounter for screening mammogram for malignant neoplasm of breast: Secondary | ICD-10-CM | POA: Diagnosis not present

## 2022-05-08 DIAGNOSIS — E559 Vitamin D deficiency, unspecified: Secondary | ICD-10-CM

## 2022-05-08 DIAGNOSIS — E118 Type 2 diabetes mellitus with unspecified complications: Secondary | ICD-10-CM | POA: Diagnosis not present

## 2022-05-08 DIAGNOSIS — E538 Deficiency of other specified B group vitamins: Secondary | ICD-10-CM

## 2022-05-08 LAB — COMPREHENSIVE METABOLIC PANEL
ALT: 13 U/L (ref 0–35)
AST: 14 U/L (ref 0–37)
Albumin: 4.4 g/dL (ref 3.5–5.2)
Alkaline Phosphatase: 91 U/L (ref 39–117)
BUN: 23 mg/dL (ref 6–23)
CO2: 27 mEq/L (ref 19–32)
Calcium: 9.7 mg/dL (ref 8.4–10.5)
Chloride: 104 mEq/L (ref 96–112)
Creatinine, Ser: 1.17 mg/dL (ref 0.40–1.20)
GFR: 45.42 mL/min — ABNORMAL LOW (ref >60.00)
Glucose, Bld: 135 mg/dL — ABNORMAL HIGH (ref 70–99)
Potassium: 4.5 mEq/L (ref 3.5–5.1)
Sodium: 140 mEq/L (ref 135–145)
Total Bilirubin: 0.5 mg/dL (ref 0.2–1.2)
Total Protein: 7.1 g/dL (ref 6.0–8.3)

## 2022-05-08 LAB — CBC WITH DIFFERENTIAL/PLATELET
Basophils Absolute: 0.1 10*3/uL (ref 0.0–0.1)
Basophils Relative: 1.1 % (ref 0.0–3.0)
Eosinophils Absolute: 0.4 10*3/uL (ref 0.0–0.7)
Eosinophils Relative: 5.5 % — ABNORMAL HIGH (ref 0.0–5.0)
HCT: 38.2 % (ref 36.0–46.0)
Hemoglobin: 12.6 g/dL (ref 12.0–15.0)
Lymphocytes Relative: 34.7 % (ref 12.0–46.0)
Lymphs Abs: 2.2 10*3/uL (ref 0.7–4.0)
MCHC: 32.9 g/dL (ref 30.0–36.0)
MCV: 93.7 fl (ref 78.0–100.0)
Monocytes Absolute: 0.5 10*3/uL (ref 0.1–1.0)
Monocytes Relative: 7 % (ref 3.0–12.0)
Neutro Abs: 3.3 10*3/uL (ref 1.4–7.7)
Neutrophils Relative %: 51.7 % (ref 43.0–77.0)
Platelets: 278 10*3/uL (ref 150.0–400.0)
RBC: 4.08 Mil/uL (ref 3.87–5.11)
RDW: 14.5 % (ref 11.5–15.5)
WBC: 6.5 10*3/uL (ref 4.0–10.5)

## 2022-05-08 LAB — LIPID PANEL
Cholesterol: 161 mg/dL (ref 0–200)
HDL: 39.2 mg/dL (ref >39.00)
LDL Cholesterol: 103 mg/dL — ABNORMAL HIGH (ref 0–99)
NonHDL: 121.68
Total CHOL/HDL Ratio: 4
Triglycerides: 93 mg/dL (ref 0.0–149.0)
VLDL: 18.6 mg/dL (ref 0.0–40.0)

## 2022-05-08 LAB — TSH: TSH: 3.53 u[IU]/mL (ref 0.35–5.50)

## 2022-05-08 LAB — HEMOGLOBIN A1C: Hgb A1c MFr Bld: 7.8 % — ABNORMAL HIGH (ref 4.6–6.5)

## 2022-05-08 LAB — VITAMIN B12: Vitamin B-12: 155 pg/mL — ABNORMAL LOW (ref 211–911)

## 2022-05-08 NOTE — Patient Instructions (Signed)
It was a pleasure meeting you today. Thank you for allowing me to take part in your health care.  Our goals for today as we discussed include:  We will get some labs today.  If they are abnormal or we need to do something about them, I will call you.  If they are normal, I will send you a message on MyChart (if it is active) or a letter in the mail.  If you don't hear from [place] in 2 weeks, please call the office at the number below.   Follow up in 1 month  Please arrive 15 minutes prior to your appointment.  Arrivals 5 minutes past your appointment time will need to be rescheduled.  This is to ensure that all patients are seen in a timely manner.  Thank you for your understanding and cooperation.      If you have any questions or concerns, please do not hesitate to call the office at [phone].  I look forward to our next visit and until then take care and stay safe.  Regards,   Carollee Leitz, MD   [facility]

## 2022-05-08 NOTE — Progress Notes (Signed)
SUBJECTIVE:   Chief Complaint  Patient presents with   Transitions Of Care   HPI Patient presents to clinic to transfer care.  No acute concerns today.  Hypertension Asymptomatic.  Reports blood pressures at home 125-130/80.  Currently takes metoprolol XL 50 mg daily and Micardis HCTZ 40-12.5 mg daily.  Tolerating medications well.  DM type II Asymptomatic.  Does not check CBGs at home.  Currently on Glucotrol XL 2.5 mg daily and Glucophage 500 mg twice daily.  Tolerating medications well.  On low-dose ASA, statin and ARB therapy.  Last eye exam documented 04/13/2019.  No retinopathy at that time.  Requires yearly foot exam.  Hyperlipidemia On statin therapy.  Takes Lipitor 80 mg daily and tolerating well.  Denies any muscle pain.    PERTINENT PMH / PSH: Hypertension Type 2 diabetes CKD stage III Hyperlipidemia   OBJECTIVE:  BP 130/80   Pulse 73   Temp 98.3 F (36.8 C) (Oral)   Ht  (1.651 m)   Wt 221 lb 3.2 oz (100.3 kg)   SpO2 99%   BMI 36.81 kg/m    Physical Exam Vitals reviewed.  Constitutional:      General: She is not in acute distress.    Appearance: She is not ill-appearing.  HENT:     Head: Normocephalic.     Nose: Nose normal.  Eyes:     Conjunctiva/sclera: Conjunctivae normal.  Neck:     Thyroid: No thyromegaly or thyroid tenderness.  Cardiovascular:     Rate and Rhythm: Normal rate and regular rhythm.     Heart sounds: Normal heart sounds.  Pulmonary:     Effort: Pulmonary effort is normal.     Breath sounds: Normal breath sounds.  Abdominal:     General: Abdomen is flat. Bowel sounds are normal.     Palpations: Abdomen is soft.  Musculoskeletal:        General: Normal range of motion.     Cervical back: Normal range of motion.  Neurological:     Mental Status: She is alert and oriented to person, place, and time. Mental status is at baseline.  Psychiatric:        Mood and Affect: Mood normal.        Behavior: Behavior normal.         Thought Content: Thought content normal.        Judgment: Judgment normal.     ASSESSMENT/PLAN:  Hypertension associated with diabetes Assessment & Plan: Chronic.  Initially elevated.  Repeat at goal. Continue metoprolol XL 25 mg daily Continue telmisartan-HCTZ 40-12.5 mg daily CMET today. Follow-up in 4 weeks.  Orders: -     Hemoglobin A1c -     CBC with Differential/Platelet -     Comprehensive metabolic panel -     Lipid panel -     TSH -     Vitamin B12  Low serum vitamin B12 Assessment & Plan: Patient currently on metformin 1 g daily Will check vitamin B12 levels today. Start vitamin B12 1000 mg sublingual daily  Orders: -     Vitamin B-12; Place 1 tablet (1,000 mcg total) under the tongue daily.  Dispense: 90 tablet; Refill: 3  DM (diabetes mellitus), type 2 with complications Assessment & Plan: Chronic.  Recent A1c 8. Repeat A1c today. Continue metformin 500 mg twice daily Continue Glucotrol XL 2.5 mg daily Recommend adding Jardiance 10 mg daily at next visit given history of CKD.  Recommend yearly diabetic eye exam  Recommend yearly diabetic foot exam.  Plan for next visit.   Stage 3b chronic kidney disease Assessment & Plan: Chronic. Recent GFR ~40. Repeat CMET Avoid nephrotoxins Consider initiation of SGLT2 next visit   Vitamin D deficiency Assessment & Plan: Chronic.  On vitamin D supplements Continue vitamin D 2000 units daily.     Mixed hyperlipidemia Assessment & Plan: Chronic.  Recent LDL not at goal On statin therapy and tolerating well.  Denies any myalgias. Continue atorvastatin 80 mg daily Repeat fasting lipids, if LDL remains above goal we will discuss addition of ezetimibe   Aortic atherosclerosis Assessment & Plan: Noted on chest x-ray 10//04/23. Currently on statin therapy.   Breast cancer screening by mammogram -     3D Screening Mammogram, Left and Right; Future  HCM DEXA 01/20, normal. Colonoscopy up-to-date.   Recommended repeat in 10 years for screening.  Due 07/2027 Mammogram yearly.  Referral sent. Medicare annual wellness due Yearly diabetic foot exam at next visit. Shingles vaccine up-to-date Pneumonia vaccine up-to-date Tetanus up-to-date  PDMP reviewed  Return in about 4 weeks (around 06/05/2022) for PCP.  Dana Allan, MD

## 2022-05-13 MED ORDER — VITAMIN B-12 1000 MCG SL SUBL
1000.0000 ug | SUBLINGUAL_TABLET | Freq: Every day | SUBLINGUAL | 3 refills | Status: AC
Start: 2022-05-13 — End: ?

## 2022-05-14 ENCOUNTER — Other Ambulatory Visit: Payer: Self-pay

## 2022-05-14 DIAGNOSIS — E785 Hyperlipidemia, unspecified: Secondary | ICD-10-CM

## 2022-05-14 DIAGNOSIS — E1165 Type 2 diabetes mellitus with hyperglycemia: Secondary | ICD-10-CM

## 2022-05-14 MED ORDER — EZETIMIBE 10 MG PO TABS
10.0000 mg | ORAL_TABLET | Freq: Every day | ORAL | 3 refills | Status: DC
Start: 2022-05-14 — End: 2022-12-03

## 2022-05-14 MED ORDER — METFORMIN HCL 500 MG PO TABS
1000.0000 mg | ORAL_TABLET | Freq: Two times a day (BID) | ORAL | 1 refills | Status: DC
Start: 2022-05-14 — End: 2022-09-02

## 2022-05-16 ENCOUNTER — Encounter: Payer: Self-pay | Admitting: Family Medicine

## 2022-05-16 DIAGNOSIS — Z1231 Encounter for screening mammogram for malignant neoplasm of breast: Secondary | ICD-10-CM | POA: Insufficient documentation

## 2022-05-16 DIAGNOSIS — E538 Deficiency of other specified B group vitamins: Secondary | ICD-10-CM | POA: Insufficient documentation

## 2022-05-16 NOTE — Assessment & Plan Note (Signed)
Chronic.  Recent LDL not at goal On statin therapy and tolerating well.  Denies any myalgias. Continue atorvastatin 80 mg daily Repeat fasting lipids, if LDL remains above goal we will discuss addition of ezetimibe

## 2022-05-16 NOTE — Assessment & Plan Note (Signed)
Noted on chest x-ray 10//04/23. Currently on statin therapy.

## 2022-05-16 NOTE — Assessment & Plan Note (Addendum)
Chronic.  Recent A1c 8. Repeat A1c today. Continue metformin 500 mg twice daily Continue Glucotrol XL 2.5 mg daily Recommend adding Jardiance 10 mg daily at next visit given history of CKD.  Recommend yearly diabetic eye exam Recommend yearly diabetic foot exam.  Plan for next visit.

## 2022-05-16 NOTE — Assessment & Plan Note (Signed)
Chronic. Recent GFR ~40. Repeat CMET Avoid nephrotoxins Consider initiation of SGLT2 next visit

## 2022-05-16 NOTE — Assessment & Plan Note (Signed)
Chronic.  Initially elevated.  Repeat at goal. Continue metoprolol XL 25 mg daily Continue telmisartan-HCTZ 40-12.5 mg daily CMET today. Follow-up in 4 weeks.

## 2022-05-16 NOTE — Assessment & Plan Note (Addendum)
Chronic.  On vitamin D supplements Continue vitamin D 2000 units daily.

## 2022-05-16 NOTE — Assessment & Plan Note (Addendum)
Patient currently on metformin 1 g daily Will check vitamin B12 levels today. Start vitamin B12 1000 mg sublingual daily

## 2022-06-05 ENCOUNTER — Encounter: Payer: Self-pay | Admitting: Family Medicine

## 2022-06-05 ENCOUNTER — Ambulatory Visit (INDEPENDENT_AMBULATORY_CARE_PROVIDER_SITE_OTHER): Payer: Medicare Other | Admitting: Family Medicine

## 2022-06-05 VITALS — BP 146/88 | HR 70 | Temp 98.2°F | Ht 65.0 in | Wt 218.6 lb

## 2022-06-05 DIAGNOSIS — E538 Deficiency of other specified B group vitamins: Secondary | ICD-10-CM

## 2022-06-05 DIAGNOSIS — E113419 Type 2 diabetes mellitus with severe nonproliferative diabetic retinopathy with macular edema, unspecified eye: Secondary | ICD-10-CM

## 2022-06-05 DIAGNOSIS — Z7984 Long term (current) use of oral hypoglycemic drugs: Secondary | ICD-10-CM

## 2022-06-05 DIAGNOSIS — E1159 Type 2 diabetes mellitus with other circulatory complications: Secondary | ICD-10-CM

## 2022-06-05 DIAGNOSIS — I152 Hypertension secondary to endocrine disorders: Secondary | ICD-10-CM | POA: Diagnosis not present

## 2022-06-05 DIAGNOSIS — E785 Hyperlipidemia, unspecified: Secondary | ICD-10-CM | POA: Diagnosis not present

## 2022-06-05 DIAGNOSIS — E118 Type 2 diabetes mellitus with unspecified complications: Secondary | ICD-10-CM

## 2022-06-05 DIAGNOSIS — N1832 Chronic kidney disease, stage 3b: Secondary | ICD-10-CM

## 2022-06-05 NOTE — Patient Instructions (Addendum)
It was a pleasure meeting you today. Thank you for allowing me to take part in your health care.  Our goals for today as we discussed include:  Stop Zetia Continue Atorvastatin 80 mg daily Would like to switch to Crestor 40 mg daily.  Please call insurance to see if covered. Monitor diet and increase activity.   Monitor blood pressure at home.  Goal less than 130/80. Please send me results of your blood pressure over the next week.  You may need to increase your blood pressure medication. Important to take your blood pressure medication daily as prescribed.  Schedule Medicare Annual Wellness Visit   Will complete foot exam at next visit.  Mammogram scheduled for May 21.  If you have any questions or concerns, please do not hesitate to call the office at [phone].  I look forward to our next visit and until then take care and stay safe.  Regards,   Dana Allan, MD   [facility]

## 2022-06-05 NOTE — Progress Notes (Signed)
SUBJECTIVE:   Chief Complaint  Patient presents with   Hypertension   Diabetes   HPI Patient presents to clinic to transfer care.  No acute concerns today.  Hypertension Asymptomatic.  Reports blood pressures at home 125-130/80.  Currently takes metoprolol XL 50 mg daily and Micardis HCTZ 40-12.5 mg daily.  Tolerating medications well.  DM type II Asymptomatic.  Does not check CBGs at home.  Currently on Glucotrol XL 2.5 mg daily and Glucophage 500 mg twice daily.  Tolerating medications well.  On low-dose ASA, statin and ARB therapy.  Last eye exam documented 04/13/2019.  No retinopathy at that time.  Requires yearly foot exam.  Hyperlipidemia On statin therapy.  Takes Lipitor 80 mg daily and tolerating well.  Denies any muscle pain.  Was prescribed Zetia but stopped taking secondary to side effects of headaches and vomiting.    PERTINENT PMH / PSH: Hypertension Type 2 diabetes CKD stage III Hyperlipidemia   OBJECTIVE:  BP (!) 146/88   Pulse 70   Temp 98.2 F (36.8 C) (Oral)   Ht 5\' 5"  (1.651 m)   Wt 218 lb 9.6 oz (99.2 kg)   SpO2 97%   BMI 36.38 kg/m    Physical Exam Vitals reviewed.  Constitutional:      General: She is not in acute distress.    Appearance: She is not ill-appearing.  HENT:     Head: Normocephalic.     Nose: Nose normal.  Eyes:     Conjunctiva/sclera: Conjunctivae normal.  Neck:     Thyroid: No thyromegaly or thyroid tenderness.  Cardiovascular:     Rate and Rhythm: Normal rate and regular rhythm.     Heart sounds: Normal heart sounds.  Pulmonary:     Effort: Pulmonary effort is normal.     Breath sounds: Normal breath sounds.  Abdominal:     General: Abdomen is flat. Bowel sounds are normal.     Palpations: Abdomen is soft.  Musculoskeletal:        General: Normal range of motion.     Cervical back: Normal range of motion.  Neurological:     Mental Status: She is alert and oriented to person, place, and time. Mental status is at  baseline.  Psychiatric:        Mood and Affect: Mood normal.        Behavior: Behavior normal.        Thought Content: Thought content normal.        Judgment: Judgment normal.     ASSESSMENT/PLAN:  Hyperlipidemia, unspecified hyperlipidemia type Assessment & Plan: Chronic.  Recent LDL  On statin therapy and tolerating well.  Denies any myalgias. Continue atorvastatin 80 mg daily Unable to tolerate Zetia secondary to headaches and vomiting. Encouraged healthy nutrition options and increase activity.   DM (diabetes mellitus), type 2 with complications [facility]) Assessment & Plan: Chronic.  Recent A1c 7.8 Continue metformin 1000 mg twice daily Continue Glucotrol XL 2.5 mg daily Consider Jardiance in future Recommend yearly diabetic eye exam Recommend yearly diabetic foot exam.  Plan for next visit.    Hypertension associated with diabetes [facility]) Assessment & Plan: Chronic.  Initially elevated.  Repeat at goal. Continue metoprolol XL 25 mg daily Continue telmisartan-HCTZ 40-12.5 mg daily Cannot find record of medications dispensed.  Will have CMA call pharmacy to check.   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  Recent A1c 7.8 Continue metformin 1000 mg twice daily Continue Glucotrol XL 2.5 mg daily Consider Jardiance in future Recommend yearly diabetic eye exam Recommend yearly diabetic foot exam.  Plan for next visit.    Stage 3b chronic kidney disease (HCC) Assessment & Plan: Chronic. Recent GFR ~40. Avoid nephrotoxins Consider initiation of SGLT2 next visit   Low serum vitamin B12 Assessment & Plan: Low vitamin B12 level. Continue vitamin B12 1000 mg sublingual daily    HCM DEXA 01/20, normal. Colonoscopy up-to-date.  Recommended repeat in 10 years for screening.  Due 07/2027 Mammogram yearly.  Referral  sent. Medicare annual wellness due Yearly diabetic foot exam at next visit. Shingles vaccine up-to-date Pneumonia vaccine up-to-date Tetanus up-to-date  PDMP reviewed  Return in about 6 months (around 12/06/2022) for PCP.  Dana Allan, MD

## 2022-06-22 ENCOUNTER — Encounter: Payer: Self-pay | Admitting: Family Medicine

## 2022-06-22 ENCOUNTER — Telehealth: Payer: Self-pay | Admitting: Family Medicine

## 2022-06-22 NOTE — Assessment & Plan Note (Addendum)
Chronic.  Recent A1c 7.8 Continue metformin 1000 mg twice daily Continue Glucotrol XL 2.5 mg daily Consider Jardiance in future Recommend yearly diabetic eye exam Recommend yearly diabetic foot exam.  Plan for next visit.

## 2022-06-22 NOTE — Assessment & Plan Note (Signed)
Chronic. Recent GFR ~40. Avoid nephrotoxins Consider initiation of SGLT2 next visit

## 2022-06-22 NOTE — Assessment & Plan Note (Signed)
Low vitamin B12 level. Continue vitamin B12 1000 mg sublingual daily

## 2022-06-22 NOTE — Assessment & Plan Note (Signed)
Chronic.  Initially elevated.  Repeat at goal. Continue metoprolol XL 25 mg daily Continue telmisartan-HCTZ 40-12.5 mg daily Cannot find record of medications dispensed.  Will have CMA call pharmacy to check.

## 2022-06-22 NOTE — Assessment & Plan Note (Addendum)
Chronic.  Recent LDL  On statin therapy and tolerating well.  Denies any myalgias. Continue atorvastatin 80 mg daily Unable to tolerate Zetia secondary to headaches and vomiting. Encouraged healthy nutrition options and increase activity.

## 2022-06-23 NOTE — Telephone Encounter (Signed)
Haley Curtis Pharmacist states the Patient picked up the Zetia and Metformin on 05/14/22 but she did not pick up the Vitamin B12.

## 2022-06-24 ENCOUNTER — Ambulatory Visit
Admission: RE | Admit: 2022-06-24 | Discharge: 2022-06-24 | Disposition: A | Discharge status: Home / Self Care | Payer: Medicare Other | Source: Ambulatory Visit | Attending: Family Medicine | Admitting: Family Medicine

## 2022-06-24 DIAGNOSIS — Z1231 Encounter for screening mammogram for malignant neoplasm of breast: Secondary | ICD-10-CM | POA: Diagnosis not present

## 2022-06-25 ENCOUNTER — Encounter: Payer: Self-pay | Admitting: Family Medicine

## 2022-06-25 NOTE — Telephone Encounter (Signed)
Noted! Thank you

## 2022-06-25 NOTE — Telephone Encounter (Signed)
Pt was not aware, will contact pharmacy today. Also discuss the risk of Vit B12 deficiency upon pt request.

## 2022-09-01 ENCOUNTER — Other Ambulatory Visit: Payer: Self-pay | Admitting: Family Medicine

## 2022-09-01 DIAGNOSIS — E1165 Type 2 diabetes mellitus with hyperglycemia: Secondary | ICD-10-CM

## 2022-10-03 DIAGNOSIS — Z23 Encounter for immunization: Secondary | ICD-10-CM | POA: Diagnosis not present

## 2022-10-16 ENCOUNTER — Ambulatory Visit: Payer: Medicare Other | Admitting: Nurse Practitioner

## 2022-10-20 ENCOUNTER — Encounter (HOSPITAL_COMMUNITY): Payer: Self-pay | Admitting: Cardiology

## 2022-11-03 ENCOUNTER — Telehealth (HOSPITAL_COMMUNITY): Payer: Self-pay | Admitting: Cardiology

## 2022-11-03 NOTE — Telephone Encounter (Signed)
Just an FYI. We have made several attempts to contact this patient including sending a letter to schedule or reschedule their echocardiogram. We will be removing the patient from the echo WQ.  10/20/22 MAILED LETTER [facility]  10/10/22 [facility] to schedule x 3 @ 10:26/[facility]  10/03/22 [facility] to schedule @ 9:19 in Oct /[facility]        Thank you

## 2022-11-07 ENCOUNTER — Other Ambulatory Visit: Payer: Self-pay

## 2022-11-07 DIAGNOSIS — I10 Essential (primary) hypertension: Secondary | ICD-10-CM

## 2022-11-07 MED ORDER — METOPROLOL SUCCINATE ER 50 MG PO TB24: ORAL_TABLET | ORAL | 3 refills | Status: DC

## 2022-12-03 ENCOUNTER — Ambulatory Visit (INDEPENDENT_AMBULATORY_CARE_PROVIDER_SITE_OTHER): Payer: Medicare Other | Admitting: Family Medicine

## 2022-12-03 ENCOUNTER — Encounter: Payer: Self-pay | Admitting: Family Medicine

## 2022-12-03 VITALS — BP 138/76 | HR 76 | Temp 98.2°F | Resp 16 | Ht 65.0 in | Wt 219.5 lb

## 2022-12-03 DIAGNOSIS — E1169 Type 2 diabetes mellitus with other specified complication: Secondary | ICD-10-CM | POA: Diagnosis not present

## 2022-12-03 DIAGNOSIS — I152 Hypertension secondary to endocrine disorders: Secondary | ICD-10-CM | POA: Diagnosis not present

## 2022-12-03 DIAGNOSIS — I10 Essential (primary) hypertension: Secondary | ICD-10-CM

## 2022-12-03 DIAGNOSIS — Z7984 Long term (current) use of oral hypoglycemic drugs: Secondary | ICD-10-CM

## 2022-12-03 DIAGNOSIS — E118 Type 2 diabetes mellitus with unspecified complications: Secondary | ICD-10-CM | POA: Diagnosis not present

## 2022-12-03 DIAGNOSIS — E785 Hyperlipidemia, unspecified: Secondary | ICD-10-CM | POA: Diagnosis not present

## 2022-12-03 DIAGNOSIS — E1159 Type 2 diabetes mellitus with other circulatory complications: Secondary | ICD-10-CM | POA: Diagnosis not present

## 2022-12-03 LAB — MICROALBUMIN / CREATININE URINE RATIO
Creatinine,U: 157.3 mg/dL
Microalb Creat Ratio: 0.7 mg/g (ref 0.0–30.0)
Microalb, Ur: 1.2 mg/dL (ref 0.0–1.9)

## 2022-12-03 MED ORDER — METFORMIN HCL 500 MG PO TABS
1000.0000 mg | ORAL_TABLET | Freq: Two times a day (BID) | ORAL | 1 refills | Status: DC
Start: 2022-12-03 — End: 2023-10-16

## 2022-12-03 MED ORDER — ATORVASTATIN CALCIUM 80 MG PO TABS
80.0000 mg | ORAL_TABLET | Freq: Every day | ORAL | 3 refills | Status: DC
Start: 2022-12-03 — End: 2023-10-16

## 2022-12-03 MED ORDER — TELMISARTAN-HCTZ 40-12.5 MG PO TABS: 1.0000 | ORAL_TABLET | Freq: Every day | ORAL | 3 refills | Status: DC

## 2022-12-03 MED ORDER — GLIPIZIDE ER 2.5 MG PO TB24
2.5000 mg | ORAL_TABLET | Freq: Every day | ORAL | 3 refills | Status: DC
Start: 2022-12-03 — End: 2023-10-16

## 2022-12-03 NOTE — Patient Instructions (Addendum)
It was a pleasure meeting you today. Thank you for allowing me to take part in your health care.  Our goals for today as we discussed include:  A1c 6.4 Decreased from previous check 7.8.  Refills sent for medications  Follow up in 6 months.  Blood work at next visit   This is a list of the screening recommended for you and due dates:  Health Maintenance  Topic Date Due   Medicare Annual Wellness Visit  Never done   COVID-19 Vaccine (4 - 2023-24 season) 10/05/2022   Yearly kidney health urinalysis for diabetes  11/07/2022   Hemoglobin A1C  11/07/2022   Yearly kidney function blood test for diabetes  05/08/2023   Mammogram  06/24/2023   Complete foot exam   12/03/2023   DTaP/Tdap/Td vaccine (2 - Td or Tdap) 04/28/2030   Pneumonia Vaccine  Completed   Flu Shot  Completed   DEXA scan (bone density measurement)  Completed   Hepatitis C Screening  Completed   Zoster (Shingles) Vaccine  Completed   HPV Vaccine  Aged Out   Eye exam for diabetics  Discontinued   Colon Cancer Screening  Discontinued      If you have any questions or concerns, please do not hesitate to call the office at [phone].  I look forward to our next visit and until then take care and stay safe.  Regards,   Dana Allan, MD   [facility]

## 2022-12-03 NOTE — Progress Notes (Signed)
SUBJECTIVE:   Chief Complaint  Patient presents with   Diabetes   HPI Presents to clinic for diabetic management  Discussed the use of AI scribe software for clinical note transcription with the patient, who gave verbal consent to proceed.  History of Present Illness  The patient, a 77 year old with a history of type 2 diabetes, hypertension, and hyperlipidemia, presents for a six-month follow-up. She reports good control of her diabetes, with home glucose readings ranging from 80 to 120, and no episodes of hypoglycemia. Her most recent A1c was 6.4, down from 7.8 at the previous visit. She is currently on glipizide and metformin for diabetes management.  The patient also reports a change in her diet, drinking more lemon water and experiencing weight loss. She denies any dizziness or other adverse symptoms related to this change.  Regarding her hypertension, the patient reports home blood pressure readings of around 124/70-80. She is currently on Micardis and Metoprolol for blood pressure management.  In terms of her hyperlipidemia, the patient had previously discontinued Zetia due to side effects but continues to take a statin with good tolerance. She recently saw a cardiologist who recommended continuation of the statin therapy.  The patient denies any chest pain, shortness of breath, or leg swelling. She reports staying active and has no additional concerns at this time.  PERTINENT PMH / PSH: As above  OBJECTIVE:  BP 138/76   Pulse 76   Temp 98.2 F (36.8 C)   Resp 16   Ht 5\' 5"  (1.651 m)   Wt 219 lb 8 oz (99.6 kg)   SpO2 100%   BMI 36.53 kg/m    Physical Exam Vitals reviewed.  Constitutional:      General: She is not in acute distress.    Appearance: Normal appearance. She is obese. She is not ill-appearing, toxic-appearing or diaphoretic.  Eyes:     General:        Right eye: No discharge.        Left eye: No discharge.     Conjunctiva/sclera: Conjunctivae  normal.  Cardiovascular:     Rate and Rhythm: Normal rate and regular rhythm.     Heart sounds: Normal heart sounds.  Pulmonary:     Effort: Pulmonary effort is normal.     Breath sounds: Normal breath sounds.  Abdominal:     General: Bowel sounds are normal.  Musculoskeletal:        General: Normal range of motion.  Skin:    General: Skin is warm and dry.  Neurological:     General: No focal deficit present.     Mental Status: She is alert and oriented to person, place, and time. Mental status is at baseline.  Psychiatric:        Mood and Affect: Mood normal.        Behavior: Behavior normal.        Thought Content: Thought content normal.        Judgment: Judgment normal.        12/03/2022    9:17 AM 06/05/2022    9:10 AM 05/08/2022   11:09 AM 11/06/2021    8:49 AM 11/30/2020    8:14 AM  Depression screen PHQ 2/9  Decreased Interest 0 0 0 0 0  Down, Depressed, Hopeless 0 0 0 0 0  PHQ - 2 Score 0 0 0 0 0  Altered sleeping 0      Tired, decreased energy 0      Change in  appetite 0      Feeling bad or failure about yourself  0      Trouble concentrating 0      Moving slowly or fidgety/restless 0      Suicidal thoughts 0      PHQ-9 Score 0      Difficult doing work/chores Not difficult at all          12/03/2022    9:17 AM 05/08/2022   11:09 AM 10/26/2019    3:24 PM  GAD 7 : Generalized Anxiety Score  Nervous, Anxious, on Edge 0 0 0  Control/stop worrying 0 0 0  Worry too much - different things 0 0 0  Trouble relaxing 0 0 0  Restless 0 0 0  Easily annoyed or irritable 1 0 0  Afraid - awful might happen 0 0 0  Total GAD 7 Score 1 0 0  Anxiety Difficulty Not difficult at all Not difficult at all     ASSESSMENT/PLAN:  Type 2 diabetes mellitus with complications [facility]) Assessment & Plan: Well controlled with home glucose readings between 80-120 and recent A1c of 6.4, down from 7.8. Discussed the risk of hypoglycemia with Glipizide and the importance of consistent  dosing. Also discussed the potential for future transition to Rybelsus. -Continue current regimen of Metformin and Glipizide. -Monitor for symptoms of hypoglycemia.  Orders: -     POCT glycosylated hemoglobin (Hb A1C) -     Microalbumin / creatinine urine ratio -     glipiZIDE ER; Take 1 tablet (2.5 mg total) by mouth daily with breakfast.  Dispense: 90 tablet; Refill: 3 -     metFORMIN HCl; Take 2 tablets (1,000 mg total) by mouth 2 (two) times daily.  Dispense: 360 tablet; Refill: 1  Hypertension associated with diabetes (HCC) Assessment & Plan: Blood pressure readings at home are within normal range, but occasional elevated readings in office likely due to white coat syndrome. -Continue current antihypertensive therapy with Micardis 50mg . -Continue Metoprolol XL 50 mg daily  Orders: -     Telmisartan-HCTZ; Take 1 tablet by mouth daily.  Dispense: 90 tablet; Refill: 3  Hyperlipidemia associated with type 2 diabetes mellitus (HCC) Assessment & Plan: Tolerating current statin therapy well, but discontinued Zetia due to side effects. Cardiology aware and has no additional recommendations. -Continue current statin therapy.  Orders: -     Atorvastatin Calcium; Take 1 tablet (80 mg total) by mouth daily at 6 PM.  Dispense: 90 tablet; Refill: 3    General Health Maintenance -Ensure up-to-date with pneumonia and shingles vaccinations.  PDMP reviewed  Return in about 6 months (around 06/03/2023) for PCP.  Dana Allan, MD

## 2022-12-04 ENCOUNTER — Encounter: Payer: Self-pay | Admitting: Family Medicine

## 2022-12-04 LAB — POCT GLYCOSYLATED HEMOGLOBIN (HGB A1C): Hemoglobin A1C: 6.4 % — AB (ref 4.0–5.6)

## 2022-12-04 NOTE — Assessment & Plan Note (Signed)
Tolerating current statin therapy well, but discontinued Zetia due to side effects. Cardiology aware and has no additional recommendations. -Continue current statin therapy.

## 2022-12-04 NOTE — Assessment & Plan Note (Signed)
Well controlled with home glucose readings between 80-120 and recent A1c of 6.4, down from 7.8. Discussed the risk of hypoglycemia with Glipizide and the importance of consistent dosing. Also discussed the potential for future transition to Rybelsus. -Continue current regimen of Metformin and Glipizide. -Monitor for symptoms of hypoglycemia.

## 2022-12-04 NOTE — Assessment & Plan Note (Signed)
Blood pressure readings at home are within normal range, but occasional elevated readings in office likely due to white coat syndrome. -Continue current antihypertensive therapy with Micardis 50mg . -Continue Metoprolol XL 50 mg daily

## 2022-12-08 ENCOUNTER — Ambulatory Visit: Payer: Medicare Other | Admitting: Family Medicine

## 2023-05-27 ENCOUNTER — Telehealth: Payer: Self-pay

## 2023-05-27 NOTE — Telephone Encounter (Signed)
 Copied from CRM [id number]. Topic: General - Other >> May 27, 2023  9:22 AM Annelle Kiel wrote: Reason for CRM: patient is needing a call back regarding a call from the office

## 2023-05-27 NOTE — Telephone Encounter (Signed)
 Called pt and she stated that someone from the office called her, but I do not see a note when anyone called her. I did advise her that it might have been a reminder about her up coming appointment.

## 2023-06-03 ENCOUNTER — Ambulatory Visit: Payer: Medicare Other | Admitting: Family Medicine

## 2023-10-13 ENCOUNTER — Encounter

## 2023-10-20 ENCOUNTER — Ambulatory Visit

## 2023-10-20 DIAGNOSIS — I10 Essential (primary) hypertension: Secondary | ICD-10-CM

## 2023-10-20 DIAGNOSIS — E1159 Type 2 diabetes mellitus with other circulatory complications: Secondary | ICD-10-CM

## 2023-10-20 DIAGNOSIS — I2510 Atherosclerotic heart disease of native coronary artery without angina pectoris: Secondary | ICD-10-CM | POA: Diagnosis not present

## 2023-10-20 DIAGNOSIS — E1169 Type 2 diabetes mellitus with other specified complication: Secondary | ICD-10-CM | POA: Diagnosis not present

## 2023-10-20 DIAGNOSIS — N1832 Chronic kidney disease, stage 3b: Secondary | ICD-10-CM | POA: Diagnosis not present

## 2023-10-20 DIAGNOSIS — E538 Deficiency of other specified B group vitamins: Secondary | ICD-10-CM

## 2023-10-20 DIAGNOSIS — Z1231 Encounter for screening mammogram for malignant neoplasm of breast: Secondary | ICD-10-CM

## 2023-10-20 DIAGNOSIS — H6992 Unspecified Eustachian tube disorder, left ear: Secondary | ICD-10-CM

## 2023-10-20 DIAGNOSIS — I152 Hypertension secondary to endocrine disorders: Secondary | ICD-10-CM | POA: Diagnosis not present

## 2023-10-20 DIAGNOSIS — E785 Hyperlipidemia, unspecified: Secondary | ICD-10-CM | POA: Diagnosis not present

## 2023-10-20 DIAGNOSIS — Z23 Encounter for immunization: Secondary | ICD-10-CM | POA: Diagnosis not present

## 2023-10-20 DIAGNOSIS — E113419 Type 2 diabetes mellitus with severe nonproliferative diabetic retinopathy with macular edema, unspecified eye: Secondary | ICD-10-CM

## 2023-10-20 DIAGNOSIS — R0683 Snoring: Secondary | ICD-10-CM

## 2023-10-20 DIAGNOSIS — Z87891 Personal history of nicotine dependence: Secondary | ICD-10-CM | POA: Insufficient documentation

## 2023-10-20 DIAGNOSIS — E118 Type 2 diabetes mellitus with unspecified complications: Secondary | ICD-10-CM

## 2023-10-20 MED ORDER — METOPROLOL SUCCINATE ER 50 MG PO TB24: ORAL_TABLET | ORAL | 3 refills | Status: AC

## 2023-10-20 MED ORDER — GLIPIZIDE ER 2.5 MG PO TB24
2.5000 mg | ORAL_TABLET | Freq: Every day | ORAL | 3 refills | Status: AC
Start: 2023-10-20 — End: ?

## 2023-10-20 MED ORDER — TELMISARTAN-HCTZ 40-12.5 MG PO TABS: 1.0000 | ORAL_TABLET | Freq: Every day | ORAL | 3 refills | Status: AC

## 2023-10-20 MED ORDER — METFORMIN HCL 500 MG PO TABS
1000.0000 mg | ORAL_TABLET | Freq: Two times a day (BID) | ORAL | 1 refills | Status: AC
Start: 2023-10-20 — End: ?

## 2023-10-20 MED ORDER — FLUTICASONE PROPIONATE 50 MCG/ACT NA SUSP: 2.0000 | Freq: Every day | NASAL | 0 refills | Status: AC

## 2023-10-20 MED ORDER — ATORVASTATIN CALCIUM 80 MG PO TABS
80.0000 mg | ORAL_TABLET | Freq: Every day | ORAL | 3 refills | Status: AC
Start: 2023-10-20 — End: ?

## 2023-10-20 NOTE — Assessment & Plan Note (Signed)
 Screening mammogram ordered. Patient and her daughter were provided with phone number to call to schedule for mammogram.

## 2023-10-20 NOTE — Assessment & Plan Note (Signed)
 Noted on previous lab, currently taking B12 1,000 mcg, anticipate improvement. Repeat B12 today, if continues to be low recommend IM B12, 1000 mcg (once weekly for 4 weeks then, once a month).

## 2023-10-20 NOTE — Assessment & Plan Note (Signed)
 BP on arrival elevated, repeat BP within goal, goal BP <130/80 mmHg.  Check BP at home which seems to be within goal vs when she comes to doctors office.  Continue Metoprolol  50 mg daily, Telmisartan -HCTZ 40-12.5 mg daily. Refill sent.  Check CMP.

## 2023-10-20 NOTE — Assessment & Plan Note (Signed)
 Regular moderate-intensity exercise, strength-building activities, and a balanced, nutrient-rich diet are essential for maintaining mobility, independence, and overall health recommended.

## 2023-10-20 NOTE — Assessment & Plan Note (Signed)
 Patient currently asymptomatic, no chest pain, palpitations. Continue Metoprolol  50 mg once daily, Aspirin 81 mg daily, Atorvastatin  80 mg and Telmisartan -hydrochlorothiazide  40-12.5 mg once daily, refill sent. Check CMP.

## 2023-10-20 NOTE — Assessment & Plan Note (Signed)
 Concern for OSA, patient declines referral to sleep specialist today. She will let me know if she changes her mind on sleep evaluation. Discussed adverse health outcomes related to untreated OSA briefly.

## 2023-10-20 NOTE — Progress Notes (Signed)
 Established Patient Office Visit   Subjective  Patient ID: Haley Curtis, female    DOB: [birth year]  Age: 78 y.o. MRN: [id number]  Chief Complaint  Patient presents with   Establish Care    She  has a past medical history of Chicken pox, Coronary artery disease, Diabetes mellitus without complication (HCC), Hyperlipidemia, Hypertension, and Urine incontinence.  HPI Discussed the use of AI scribe software for clinical note transcription with the patient, who gave verbal consent to proceed.  History of Present Illness Haley Curtis is a 78 year old female who presents with her daughter to establish care and chronic medication management.   She has a history of diabetes with CKD, HTN, hyperlipidemia, CAD.  DM managed with glipizide  2.5 mg once daily and metformin  500 mg, 2 tab in AM and 2 tab in PM. She experiences occasional diarrhea with metformin , especially if not taken with food. Her last B12 level was low, and she is currently taking a B12 supplement. She is due for a repeat of her hemoglobin A1c and B12 tests. She is due for a repeat of these tests.  She has a history of hypertension and is on metoprolol  50 mg daily and another blood pressure medication that is a combination pill. Her blood pressure tends to be higher during office visits but is usually lower at home. She used to see a cardiologist but does not currently have one.  She has a history of smoking for more than 20 years, smoking more than one pack per day. She qualifies for low dose CT lung cancer screening.   She engages in physical activity, running three times a week for 30 to 45 minutes. She eats mostly homemade food, avoids soda, and occasionally consumes sweet tea and red meat.  She reports having some hearing issues and occasional snoring at night. She wakes up feeling refreshed and does not experience significant daytime fatigue.  She has a history of vision problems in one eye, which she describes as 'pretty  bad' in the middle of her vision but better when looking to the side.  ROS As per HPI    Objective:     BP 130/88 (BP Location: Right Arm, Patient Position: Sitting, Cuff Size: Normal)   Pulse 76   Temp 98.7 F (37.1 C) (Oral)   Ht 5' 3 (1.6 m)   Wt 221 lb (100.2 kg)   SpO2 96%   BMI 39.15 kg/m      10/20/2023    1:51 PM 12/03/2022    9:17 AM 06/05/2022    9:10 AM  Depression screen PHQ 2/9  Decreased Interest 0 0 0  Down, Depressed, Hopeless 0 0 0  PHQ - 2 Score 0 0 0  Altered sleeping 0 0   Tired, decreased energy 0 0   Change in appetite 0 0   Feeling bad or failure about yourself  0 0   Trouble concentrating 0 0   Moving slowly or fidgety/restless 0 0   Suicidal thoughts 0 0   PHQ-9 Score 0 0   Difficult doing work/chores Not difficult at all Not difficult at all       10/20/2023    1:51 PM 12/03/2022    9:17 AM 05/08/2022   11:09 AM 10/26/2019    3:24 PM  GAD 7 : Generalized Anxiety Score  Nervous, Anxious, on Edge 0 0 0 0  Control/stop worrying 0 0 0 0  Worry too much - different things 0 0 0 0  Trouble relaxing 0 0 0 0  Restless 0 0 0 0  Easily annoyed or irritable 1 1 0 0  Afraid - awful might happen 0 0 0 0  Total GAD 7 Score 1 1 0 0  Anxiety Difficulty Not difficult at all Not difficult at all Not difficult at all       10/20/2023    1:51 PM 12/03/2022    9:17 AM 06/05/2022    9:10 AM  Depression screen PHQ 2/9  Decreased Interest 0 0 0  Down, Depressed, Hopeless 0 0 0  PHQ - 2 Score 0 0 0  Altered sleeping 0 0   Tired, decreased energy 0 0   Change in appetite 0 0   Feeling bad or failure about yourself  0 0   Trouble concentrating 0 0   Moving slowly or fidgety/restless 0 0   Suicidal thoughts 0 0   PHQ-9 Score 0 0   Difficult doing work/chores Not difficult at all Not difficult at all       10/20/2023    1:51 PM 12/03/2022    9:17 AM 05/08/2022   11:09 AM 10/26/2019    3:24 PM  GAD 7 : Generalized Anxiety Score  Nervous, Anxious, on Edge  0 0 0 0  Control/stop worrying 0 0 0 0  Worry too much - different things 0 0 0 0  Trouble relaxing 0 0 0 0  Restless 0 0 0 0  Easily annoyed or irritable 1 1 0 0  Afraid - awful might happen 0 0 0 0  Total GAD 7 Score 1 1 0 0  Anxiety Difficulty Not difficult at all Not difficult at all Not difficult at all    SDOH Screenings   Depression (PHQ2-9): Low Risk  (10/20/2023)  Tobacco Use: Medium Risk (10/20/2023)     Physical Exam Constitutional:      General: She is not in acute distress.    Appearance: She is obese.  HENT:     Head: Normocephalic and atraumatic.     Right Ear: Tympanic membrane and external ear normal. Tympanic membrane is not erythematous or bulging.     Left Ear: Tympanic membrane is bulging. Tympanic membrane is not erythematous.     Mouth/Throat:     Mouth: Mucous membranes are moist.  Cardiovascular:     Rate and Rhythm: Normal rate.  Pulmonary:     Effort: Pulmonary effort is normal.     Breath sounds: Normal breath sounds.  Abdominal:     Palpations: Abdomen is soft.     Tenderness: There is no abdominal tenderness.  Musculoskeletal:     Cervical back: Neck supple. No rigidity.     Right lower leg: No edema.     Left lower leg: No edema.  Skin:    General: Skin is warm.  Neurological:     Mental Status: She is alert and oriented to person, place, and time.  Psychiatric:        Mood and Affect: Mood normal.        No results found for any visits on 10/20/23.  The 10-year ASCVD risk score (Arnett DK, et al., 2019) is: 26.5%     Assessment & Plan:  Patient is a pleasant 78 year old female here with her daughter for [facility] from previous PCP and chronic medication management.   Obesity, morbid (HCC) Assessment & Plan: Regular moderate-intensity exercise, strength-building activities, and a balanced, nutrient-rich diet are essential for maintaining mobility, independence,  and overall health recommended.     Hyperlipidemia associated with  type 2 diabetes mellitus (HCC) Assessment & Plan: Chronic, tolerating atorvastatin  80 mg daily, continue. Emphasized increasing moderate intensity exercise with 2-3 days of strength building exercise, healthy eating including low carbohydrate rich food.   Orders: -     Atorvastatin  Calcium ; Take 1 tablet (80 mg total) by mouth daily at 6 PM.  Dispense: 90 tablet; Refill: 3  Type 2 diabetes mellitus with complications (HCC) Assessment & Plan: Chronic, check A1c, urine microalbumin today.  Continue Metformin  1000 mg BID (500 mg, two tab, BID), Glipizide  2.5 mg once daily.  If microalbuminuria d/c Glipizide  2.5 mg once daily and start patient on Farxiga 10 mg daily to help with nephropathy.  Recommend update COVID-19 immunization through pharmacy.  Recommend annual eye exam.    Orders: -     glipiZIDE  ER; Take 1 tablet (2.5 mg total) by mouth daily with breakfast.  Dispense: 90 tablet; Refill: 3 -     metFORMIN  HCl; Take 2 tablets (1,000 mg total) by mouth 2 (two) times daily.  Dispense: 360 tablet; Refill: 1 -     Telmisartan -HCTZ; Take 1 tablet by mouth daily.  Dispense: 90 tablet; Refill: 3  Hypertension associated with diabetes (HCC) Assessment & Plan: BP on arrival elevated, repeat BP within goal, goal BP <130/80 mmHg.  Check BP at home which seems to be within goal vs when she comes to doctors office.  Continue Metoprolol  50 mg daily, Telmisartan -HCTZ 40-12.5 mg daily. Refill sent.  Check CMP.   Orders: -     Metoprolol  Succinate ER; TAKE ONE TABLET BY MOUTH DAILY  Dispense: 90 tablet; Refill: 3 -     Telmisartan -HCTZ; Take 1 tablet by mouth daily.  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 Avoid NSAIDs: Advil , Motrin, Aleve.  Check CMP, urine microalbumin, if +microalbuminuria recommend SGLT2. I had discussion about this with both patient and her daughter today including risks/benefits, consider Farxiga/Dapagliflozin 10 mg and d/c Glipizide  if started on Farxiga.      Breast cancer screening by mammogram Assessment & Plan: Screening mammogram ordered. Patient and her daughter were provided with phone number to call to schedule for mammogram.   Orders: -     3D Screening Mammogram, Left and Right; Future  Acute dysfunction of left eustachian tube Assessment & Plan: Left TM bulging, no erythema. Start nasal Flonase  2 puffs in each nostril daily for 7-10 days, f/u if not improved. Prescription sent.   Orders: -     Fluticasone  Propionate; Place 2 sprays into both nostrils daily.  Dispense: 11.1 mL; Refill: 0  History of smoking 10-25 pack years Assessment & Plan: History of smoking, quit in 2006, when she was smoking she had a more than 20-pack-year history, qualifying for low dose CT for lung cancer screening. Counseling provided, patient declines low dose CT lungs for now. She will reach out to our office if she changes her mind.    Coronary artery disease involving native coronary artery of native heart without angina pectoris Assessment & Plan: Patient currently asymptomatic, no chest pain, palpitations. Continue Metoprolol  50  mg once daily, Aspirin 81 mg daily, Atorvastatin  80 mg and Telmisartan -hydrochlorothiazide  40-12.5 mg once daily, refill sent. Check CMP.   Orders: -     Atorvastatin  Calcium ; Take 1 tablet (80 mg total) by mouth daily at 6 PM.  Dispense: 90 tablet; Refill: 3  B12 deficiency Assessment & Plan: Noted on previous lab, currently taking B12 1,000 mcg, anticipate improvement. Repeat B12 today, if continues to be low recommend IM B12, 1000 mcg (once weekly for 4 weeks then, once a  month).   Orders: -     Vitamin B12  Snoring Assessment & Plan: Concern for OSA, patient declines referral to sleep specialist today. She will let me know if she changes her mind on sleep evaluation. Discussed adverse health outcomes related to untreated OSA briefly.     Needs flu shot Assessment & Plan: Updated today.   Orders: -     Flu vaccine HIGH DOSE PF(Fluzone Trivalent)    Return in about 6 months (around 04/18/2024).   Luke Shade, MD

## 2023-10-20 NOTE — Patient Instructions (Addendum)
 YOUR MAMMOGRAM IS DUE, PLEASE CALL AND GET THIS SCHEDULED! [facility] - call [phone]    Use nasal flonase , 2 puff in each nostril twice a day for 7-10 days.

## 2023-10-20 NOTE — Assessment & Plan Note (Signed)
 Left TM bulging, no erythema. Start nasal Flonase  2 puffs in each nostril daily for 7-10 days, f/u if not improved. Prescription sent.

## 2023-10-20 NOTE — Assessment & Plan Note (Signed)
 History of smoking, quit in 2006, when she was smoking she had a more than 20-pack-year history, qualifying for low dose CT for lung cancer screening. Counseling provided, patient declines low dose CT lungs for now. She will reach out to our office if she changes her mind.

## 2023-10-20 NOTE — Assessment & Plan Note (Signed)
 Chronic, check A1c, urine microalbumin today.  Continue Metformin  1000 mg BID (500 mg, two tab, BID), Glipizide  2.5 mg once daily.  If microalbuminuria d/c Glipizide  2.5 mg once daily and start patient on Farxiga 10 mg daily to help with nephropathy.  Recommend update COVID-19 immunization through pharmacy.  Recommend annual eye exam.

## 2023-10-20 NOTE — Assessment & Plan Note (Signed)
 Updated today.

## 2023-10-20 NOTE — Assessment & Plan Note (Addendum)
 Chronic, likely secondary to DM, HTN. Avoid NSAIDs: Advil , Motrin, Aleve.  Check CMP, urine microalbumin, if +microalbuminuria recommend SGLT2. I had discussion about this with both patient and her daughter today including risks/benefits, consider Farxiga/Dapagliflozin 10 mg and d/c Glipizide  if started on Farxiga.

## 2023-10-20 NOTE — Assessment & Plan Note (Signed)
 Chronic, tolerating atorvastatin  80 mg daily, continue. Emphasized increasing moderate intensity exercise with 2-3 days of strength building exercise, healthy eating including low carbohydrate rich food.

## 2023-10-21 ENCOUNTER — Ambulatory Visit: Payer: Self-pay

## 2023-10-21 LAB — COMPREHENSIVE METABOLIC PANEL WITH GFR
ALT: 13 U/L (ref 0–35)
AST: 14 U/L (ref 0–37)
Albumin: 4.4 g/dL (ref 3.5–5.2)
Alkaline Phosphatase: 71 U/L (ref 39–117)
BUN: 24 mg/dL — ABNORMAL HIGH (ref 6–23)
CO2: 27 meq/L (ref 19–32)
Calcium: 9.7 mg/dL (ref 8.4–10.5)
Chloride: 106 meq/L (ref 96–112)
Creatinine, Ser: 1.12 mg/dL (ref 0.40–1.20)
GFR: 47.38 mL/min — ABNORMAL LOW (ref >60.00)
Glucose, Bld: 118 mg/dL — ABNORMAL HIGH (ref 70–99)
Potassium: 4.6 meq/L (ref 3.5–5.1)
Sodium: 143 meq/L (ref 135–145)
Total Bilirubin: 0.5 mg/dL (ref 0.2–1.2)
Total Protein: 6.9 g/dL (ref 6.0–8.3)

## 2023-10-21 LAB — MICROALBUMIN / CREATININE URINE RATIO
Creatinine,U: 181.2 mg/dL
Microalb Creat Ratio: 9.3 mg/g (ref 0.0–30.0)
Microalb, Ur: 1.7 mg/dL (ref 0.0–1.9)

## 2023-10-21 LAB — VITAMIN B12: Vitamin B-12: 1100 pg/mL — ABNORMAL HIGH (ref 211–911)

## 2023-10-21 LAB — HEMOGLOBIN A1C: Hgb A1c MFr Bld: 8.2 % — ABNORMAL HIGH (ref 4.6–6.5)

## 2023-10-27 ENCOUNTER — Telehealth: Payer: Self-pay

## 2023-10-27 NOTE — Telephone Encounter (Signed)
 In that care please make sure she takes Metformin  1,000 mg twice a day  (2 tab in the morning and 2 tab in the evening and continues to take Glipizide  2.5 mg daily. I recommend follow up in 3 months for repeat labs and office visit.   Thank you,  Luke Shade, MD

## 2023-10-27 NOTE — Telephone Encounter (Signed)
 Patient was identified as falling into the [place] Measure - Diabetes.   Patient was: Attribution and/or data issue.  Validation/Investigation needed.  Explanation:  Patient had recent A1C done 10/20/23, was started on new medication.     Patient needs to be scheduled for a 3 month follow up due to increased A1C and starting new medications.  Phone call to patient to schedule that appointment.  No answer.  Left a message to call the office to schedule appointment.  Will also send a mychart message.

## 2023-10-27 NOTE — Telephone Encounter (Signed)
 Copied from CRM #[id number]. Topic: Clinical - Medication Question >> Oct 27, 2023  9:50 AM Anairis L wrote: Reason for RMF:Ijlhyuzm Reneta is wanting you all to know that  Patient had been out of metformin  for a couple of months and she thinks that may have increased her A1C.  She would like her mother to stay on the same medication and dose to see if that was the issue.

## 2023-12-15 ENCOUNTER — Telehealth: Payer: Self-pay

## 2023-12-15 NOTE — Telephone Encounter (Signed)
 Patient was identified as falling into the [place] Measure - Diabetes.   Patient was: Appointment already scheduled for:  01/29/24.

## 2024-01-29 ENCOUNTER — Ambulatory Visit

## 2024-02-15 ENCOUNTER — Telehealth: Payer: Self-pay

## 2024-02-15 NOTE — Telephone Encounter (Signed)
 Lm and sent MyChart message;  Your appontment on 04/29/2024 has a time change to 10;30AM. Please call the office to reschedule your appointment if you are unable to come at this time  Thank you.

## 2024-04-19 ENCOUNTER — Ambulatory Visit
